# Patient Record
Sex: Male | Born: 1955
Health system: Southern US, Community
[De-identification: ages and names within clinical notes are randomized; demographics above are authoritative.]

## PROBLEM LIST (undated history)

## (undated) DIAGNOSIS — B20 Human immunodeficiency virus [HIV] disease: Secondary | ICD-10-CM

## (undated) DIAGNOSIS — C801 Malignant (primary) neoplasm, unspecified: Secondary | ICD-10-CM

## (undated) DIAGNOSIS — Z21 Asymptomatic human immunodeficiency virus [HIV] infection status: Secondary | ICD-10-CM

## (undated) DIAGNOSIS — E785 Hyperlipidemia, unspecified: Secondary | ICD-10-CM

## (undated) DIAGNOSIS — I219 Acute myocardial infarction, unspecified: Secondary | ICD-10-CM

## (undated) HISTORY — PX: OTHER SURGICAL HISTORY: SHX169

## (undated) HISTORY — DX: Asymptomatic human immunodeficiency virus (hiv) infection status: Z21

## (undated) HISTORY — DX: Acute myocardial infarction, unspecified: I21.9

## (undated) HISTORY — DX: Human immunodeficiency virus (HIV) disease: B20

## (undated) HISTORY — DX: Malignant (primary) neoplasm, unspecified: C80.1

## (undated) HISTORY — PX: NASAL SEPTUM SURGERY: SHX37

## (undated) HISTORY — PX: PTCA: SHX146

## (undated) HISTORY — DX: Hyperlipidemia, unspecified: E78.5

---

## 1997-11-10 ENCOUNTER — Encounter (INDEPENDENT_AMBULATORY_CARE_PROVIDER_SITE_OTHER): Payer: Self-pay | Admitting: *Deleted

## 1997-11-10 LAB — CONVERTED CEMR LAB: CD4 Count: 156 microliters

## 2000-09-10 ENCOUNTER — Ambulatory Visit (HOSPITAL_COMMUNITY): Admission: RE | Admit: 2000-09-10 | Discharge: 2000-09-10 | Payer: Self-pay | Admitting: Infectious Diseases

## 2000-09-10 ENCOUNTER — Encounter: Admission: RE | Admit: 2000-09-10 | Discharge: 2000-09-10 | Payer: Self-pay | Admitting: Infectious Diseases

## 2000-09-10 ENCOUNTER — Encounter: Payer: Self-pay | Admitting: Infectious Diseases

## 2000-11-06 ENCOUNTER — Encounter: Admission: RE | Admit: 2000-11-06 | Discharge: 2000-11-06 | Payer: Self-pay | Admitting: Infectious Diseases

## 2001-03-24 ENCOUNTER — Encounter: Admission: RE | Admit: 2001-03-24 | Discharge: 2001-03-24 | Payer: Self-pay | Admitting: Infectious Diseases

## 2001-03-24 ENCOUNTER — Ambulatory Visit (HOSPITAL_COMMUNITY): Admission: RE | Admit: 2001-03-24 | Discharge: 2001-03-24 | Payer: Self-pay | Admitting: Infectious Diseases

## 2001-04-09 ENCOUNTER — Encounter: Admission: RE | Admit: 2001-04-09 | Discharge: 2001-04-09 | Payer: Self-pay | Admitting: Infectious Diseases

## 2001-05-10 ENCOUNTER — Inpatient Hospital Stay (HOSPITAL_COMMUNITY): Admission: EM | Admit: 2001-05-10 | Discharge: 2001-05-14 | Payer: Self-pay | Admitting: *Deleted

## 2001-05-10 ENCOUNTER — Encounter: Payer: Self-pay | Admitting: *Deleted

## 2001-05-10 DIAGNOSIS — I259 Chronic ischemic heart disease, unspecified: Secondary | ICD-10-CM | POA: Insufficient documentation

## 2001-05-10 DIAGNOSIS — I252 Old myocardial infarction: Secondary | ICD-10-CM

## 2001-05-11 ENCOUNTER — Encounter: Payer: Self-pay | Admitting: Cardiology

## 2001-06-03 ENCOUNTER — Encounter (HOSPITAL_COMMUNITY): Admission: RE | Admit: 2001-06-03 | Discharge: 2001-09-01 | Payer: Self-pay | Admitting: Cardiology

## 2001-09-02 ENCOUNTER — Encounter: Admission: RE | Admit: 2001-09-02 | Discharge: 2001-09-02 | Payer: Self-pay | Admitting: Infectious Diseases

## 2001-09-02 ENCOUNTER — Encounter (HOSPITAL_COMMUNITY): Admission: RE | Admit: 2001-09-02 | Discharge: 2001-10-17 | Payer: Self-pay | Admitting: Cardiology

## 2001-09-02 ENCOUNTER — Ambulatory Visit (HOSPITAL_COMMUNITY): Admission: RE | Admit: 2001-09-02 | Discharge: 2001-09-02 | Payer: Self-pay | Admitting: Infectious Diseases

## 2001-09-16 ENCOUNTER — Encounter: Admission: RE | Admit: 2001-09-16 | Discharge: 2001-09-16 | Payer: Self-pay | Admitting: Infectious Diseases

## 2001-10-22 ENCOUNTER — Ambulatory Visit (HOSPITAL_COMMUNITY): Admission: RE | Admit: 2001-10-22 | Discharge: 2001-10-22 | Payer: Self-pay | Admitting: Cardiology

## 2001-10-22 ENCOUNTER — Encounter: Payer: Self-pay | Admitting: Cardiology

## 2002-02-23 ENCOUNTER — Ambulatory Visit (HOSPITAL_COMMUNITY): Admission: RE | Admit: 2002-02-23 | Discharge: 2002-02-23 | Payer: Self-pay | Admitting: Infectious Diseases

## 2002-02-23 ENCOUNTER — Encounter: Admission: RE | Admit: 2002-02-23 | Discharge: 2002-02-23 | Payer: Self-pay | Admitting: Infectious Diseases

## 2002-03-09 ENCOUNTER — Encounter: Admission: RE | Admit: 2002-03-09 | Discharge: 2002-03-09 | Payer: Self-pay | Admitting: Infectious Diseases

## 2002-09-16 ENCOUNTER — Encounter: Admission: RE | Admit: 2002-09-16 | Discharge: 2002-09-16 | Payer: Self-pay | Admitting: Infectious Diseases

## 2002-09-30 ENCOUNTER — Ambulatory Visit (HOSPITAL_COMMUNITY): Admission: RE | Admit: 2002-09-30 | Discharge: 2002-09-30 | Payer: Self-pay | Admitting: Cardiology

## 2002-09-30 ENCOUNTER — Encounter: Payer: Self-pay | Admitting: Cardiology

## 2003-03-16 ENCOUNTER — Encounter: Payer: Self-pay | Admitting: Infectious Diseases

## 2003-03-16 ENCOUNTER — Ambulatory Visit (HOSPITAL_COMMUNITY): Admission: RE | Admit: 2003-03-16 | Discharge: 2003-03-16 | Payer: Self-pay | Admitting: Infectious Diseases

## 2003-03-16 ENCOUNTER — Encounter: Admission: RE | Admit: 2003-03-16 | Discharge: 2003-03-16 | Payer: Self-pay | Admitting: Infectious Diseases

## 2003-04-01 ENCOUNTER — Encounter: Admission: RE | Admit: 2003-04-01 | Discharge: 2003-04-01 | Payer: Self-pay | Admitting: Infectious Diseases

## 2003-10-13 ENCOUNTER — Ambulatory Visit (HOSPITAL_COMMUNITY): Admission: RE | Admit: 2003-10-13 | Discharge: 2003-10-13 | Payer: Self-pay | Admitting: Infectious Diseases

## 2003-10-13 ENCOUNTER — Encounter: Admission: RE | Admit: 2003-10-13 | Discharge: 2003-10-13 | Payer: Self-pay | Admitting: Infectious Diseases

## 2003-11-03 ENCOUNTER — Encounter: Admission: RE | Admit: 2003-11-03 | Discharge: 2003-11-03 | Payer: Self-pay | Admitting: Infectious Diseases

## 2004-03-20 ENCOUNTER — Ambulatory Visit: Payer: Self-pay | Admitting: Infectious Diseases

## 2004-03-20 ENCOUNTER — Ambulatory Visit (HOSPITAL_COMMUNITY): Admission: RE | Admit: 2004-03-20 | Discharge: 2004-03-20 | Payer: Self-pay | Admitting: Infectious Diseases

## 2004-04-03 ENCOUNTER — Ambulatory Visit: Payer: Self-pay | Admitting: Infectious Diseases

## 2004-10-18 ENCOUNTER — Ambulatory Visit (HOSPITAL_COMMUNITY): Admission: RE | Admit: 2004-10-18 | Discharge: 2004-10-18 | Payer: Self-pay | Admitting: Infectious Diseases

## 2004-10-18 ENCOUNTER — Ambulatory Visit: Payer: Self-pay | Admitting: Infectious Diseases

## 2004-11-08 ENCOUNTER — Ambulatory Visit: Payer: Self-pay | Admitting: Infectious Diseases

## 2004-12-11 ENCOUNTER — Ambulatory Visit: Payer: Self-pay | Admitting: Gastroenterology

## 2005-02-16 ENCOUNTER — Ambulatory Visit: Payer: Self-pay | Admitting: Gastroenterology

## 2005-03-21 ENCOUNTER — Ambulatory Visit (HOSPITAL_COMMUNITY): Admission: RE | Admit: 2005-03-21 | Discharge: 2005-03-21 | Payer: Self-pay | Admitting: Infectious Diseases

## 2005-03-21 ENCOUNTER — Encounter (INDEPENDENT_AMBULATORY_CARE_PROVIDER_SITE_OTHER): Payer: Self-pay | Admitting: *Deleted

## 2005-03-21 ENCOUNTER — Ambulatory Visit: Payer: Self-pay | Admitting: Infectious Diseases

## 2005-03-21 LAB — CONVERTED CEMR LAB
CD4 Count: 810 microliters
HIV 1 RNA Quant: 399 copies/mL

## 2005-04-04 ENCOUNTER — Ambulatory Visit: Payer: Self-pay | Admitting: Infectious Diseases

## 2005-10-15 ENCOUNTER — Encounter: Admission: RE | Admit: 2005-10-15 | Discharge: 2005-10-15 | Payer: Self-pay | Admitting: Infectious Diseases

## 2005-10-15 ENCOUNTER — Encounter (INDEPENDENT_AMBULATORY_CARE_PROVIDER_SITE_OTHER): Payer: Self-pay | Admitting: *Deleted

## 2005-10-15 ENCOUNTER — Ambulatory Visit: Payer: Self-pay | Admitting: Infectious Diseases

## 2005-10-15 LAB — CONVERTED CEMR LAB: CD4 Count: 610 microliters

## 2005-11-07 ENCOUNTER — Ambulatory Visit: Payer: Self-pay | Admitting: Infectious Diseases

## 2006-01-31 DIAGNOSIS — B20 Human immunodeficiency virus [HIV] disease: Secondary | ICD-10-CM

## 2006-01-31 DIAGNOSIS — H3090 Unspecified chorioretinal inflammation, unspecified eye: Secondary | ICD-10-CM | POA: Insufficient documentation

## 2006-01-31 DIAGNOSIS — E785 Hyperlipidemia, unspecified: Secondary | ICD-10-CM

## 2006-01-31 DIAGNOSIS — I2510 Atherosclerotic heart disease of native coronary artery without angina pectoris: Secondary | ICD-10-CM | POA: Insufficient documentation

## 2006-01-31 DIAGNOSIS — Z85828 Personal history of other malignant neoplasm of skin: Secondary | ICD-10-CM

## 2006-03-04 ENCOUNTER — Encounter (HOSPITAL_COMMUNITY): Admission: RE | Admit: 2006-03-04 | Discharge: 2006-03-04 | Payer: Self-pay | Admitting: Cardiology

## 2006-03-25 ENCOUNTER — Ambulatory Visit: Payer: Self-pay | Admitting: Infectious Diseases

## 2006-03-25 ENCOUNTER — Encounter (INDEPENDENT_AMBULATORY_CARE_PROVIDER_SITE_OTHER): Payer: Self-pay | Admitting: *Deleted

## 2006-03-25 ENCOUNTER — Encounter: Admission: RE | Admit: 2006-03-25 | Discharge: 2006-03-25 | Payer: Self-pay | Admitting: Infectious Diseases

## 2006-04-10 ENCOUNTER — Ambulatory Visit: Payer: Self-pay | Admitting: Infectious Diseases

## 2006-06-14 DIAGNOSIS — B009 Herpesviral infection, unspecified: Secondary | ICD-10-CM | POA: Insufficient documentation

## 2006-06-14 DIAGNOSIS — A318 Other mycobacterial infections: Secondary | ICD-10-CM

## 2006-06-17 ENCOUNTER — Encounter (INDEPENDENT_AMBULATORY_CARE_PROVIDER_SITE_OTHER): Payer: Self-pay | Admitting: *Deleted

## 2006-06-30 ENCOUNTER — Encounter (INDEPENDENT_AMBULATORY_CARE_PROVIDER_SITE_OTHER): Payer: Self-pay | Admitting: *Deleted

## 2006-10-14 ENCOUNTER — Telehealth: Payer: Self-pay | Admitting: Infectious Diseases

## 2006-10-30 ENCOUNTER — Ambulatory Visit: Payer: Self-pay | Admitting: Infectious Diseases

## 2006-10-30 ENCOUNTER — Encounter: Admission: RE | Admit: 2006-10-30 | Discharge: 2006-10-30 | Payer: Self-pay | Admitting: Infectious Diseases

## 2006-10-30 LAB — CONVERTED CEMR LAB
ALT: 18 units/L (ref 0–53)
AST: 18 units/L (ref 0–37)
Albumin: 3.9 g/dL (ref 3.5–5.2)
Alkaline Phosphatase: 37 units/L — ABNORMAL LOW (ref 39–117)
CO2: 26 meq/L (ref 19–32)
Calcium: 9.2 mg/dL (ref 8.4–10.5)
HDL: 55 mg/dL (ref >39)
HIV 1 RNA Quant: <50 copies/mL (ref <50)
HIV-1 RNA Quant, Log: <1.7 (ref <1.70)
Hemoglobin, Urine: NEGATIVE
Hemoglobin: 15.2 g/dL (ref 13.0–17.0)
Ketones, ur: NEGATIVE mg/dL
Lymphocytes Relative: 35 % (ref 12–46)
Lymphs Abs: 1.6 10*3/uL (ref 0.7–3.3)
MCHC: 34.9 g/dL (ref 30.0–36.0)
Monocytes Absolute: 0.6 10*3/uL (ref 0.2–0.7)
Monocytes Relative: 12 % — ABNORMAL HIGH (ref 3–11)
Neutro Abs: 2.2 10*3/uL (ref 1.7–7.7)
Neutrophils Relative %: 50 % (ref 43–77)
Nitrite: NEGATIVE
Protein, ur: NEGATIVE mg/dL
RDW: 12.8 % (ref 11.5–14.0)
Specific Gravity, Urine: 1.02 (ref 1.005–1.03)
Total Protein: 6.1 g/dL (ref 6.0–8.3)
Urobilinogen, UA: 0.2 (ref 0.0–1.0)
VLDL: 37 mg/dL (ref 0–40)

## 2006-10-31 ENCOUNTER — Encounter: Payer: Self-pay | Admitting: Infectious Diseases

## 2006-10-31 LAB — CONVERTED CEMR LAB: CD4 Count: 630 microliters

## 2006-11-20 ENCOUNTER — Telehealth: Payer: Self-pay | Admitting: Infectious Diseases

## 2006-11-20 ENCOUNTER — Ambulatory Visit: Payer: Self-pay | Admitting: Infectious Diseases

## 2006-11-22 ENCOUNTER — Encounter: Payer: Self-pay | Admitting: Infectious Diseases

## 2007-02-13 ENCOUNTER — Encounter: Admission: RE | Admit: 2007-02-13 | Discharge: 2007-02-13 | Payer: Self-pay | Admitting: Infectious Diseases

## 2007-02-13 ENCOUNTER — Ambulatory Visit: Payer: Self-pay | Admitting: *Deleted

## 2007-02-13 ENCOUNTER — Encounter: Payer: Self-pay | Admitting: Infectious Diseases

## 2007-02-13 LAB — CONVERTED CEMR LAB
Basophils Absolute: 0 10*3/uL (ref 0.0–0.1)
Basophils Relative: 0 % (ref 0–1)
Calcium: 8.7 mg/dL (ref 8.4–10.5)
Chloride: 107 meq/L (ref 96–112)
Creatinine, Ser: 0.95 mg/dL (ref 0.40–1.50)
Eosinophils Relative: 2 % (ref 0–5)
HIV-1 RNA Quant, Log: <1.7 (ref <1.70)
Lymphocytes Relative: 38 % (ref 12–46)
Lymphs Abs: 2 10*3/uL (ref 0.7–3.3)
Monocytes Relative: 11 % (ref 3–11)
Potassium: 4.3 meq/L (ref 3.5–5.3)
RDW: 12.8 % (ref 11.5–14.0)
Sodium: 141 meq/L (ref 135–145)
Total Protein: 5.6 g/dL — ABNORMAL LOW (ref 6.0–8.3)
WBC: 5.4 10*3/uL (ref 4.0–10.5)

## 2007-02-24 ENCOUNTER — Telehealth: Payer: Self-pay | Admitting: Infectious Diseases

## 2007-04-14 ENCOUNTER — Ambulatory Visit: Payer: Self-pay | Admitting: Infectious Diseases

## 2007-05-16 ENCOUNTER — Telehealth (INDEPENDENT_AMBULATORY_CARE_PROVIDER_SITE_OTHER): Payer: Self-pay | Admitting: *Deleted

## 2007-09-03 ENCOUNTER — Encounter: Payer: Self-pay | Admitting: Infectious Diseases

## 2007-10-15 ENCOUNTER — Encounter: Admission: RE | Admit: 2007-10-15 | Discharge: 2007-10-15 | Payer: Self-pay | Admitting: Infectious Diseases

## 2007-10-15 ENCOUNTER — Ambulatory Visit: Payer: Self-pay | Admitting: Infectious Diseases

## 2007-10-15 LAB — CONVERTED CEMR LAB
ALT: 36 units/L (ref 0–53)
AST: 29 units/L (ref 0–37)
Albumin: 4 g/dL (ref 3.5–5.2)
Alkaline Phosphatase: 46 units/L (ref 39–117)
BUN: 18 mg/dL (ref 6–23)
CO2: 25 meq/L (ref 19–32)
Chloride: 107 meq/L (ref 96–112)
Glucose, Bld: 103 mg/dL — ABNORMAL HIGH (ref 70–99)
HCT: 42.7 % (ref 39.0–52.0)
HIV-1 RNA Quant, Log: <1.7 (ref <1.70)
Lymphs Abs: 1.8 10*3/uL (ref 0.7–4.0)
MCV: 101.2 fL — ABNORMAL HIGH (ref 78.0–100.0)
Neutro Abs: 2.8 10*3/uL (ref 1.7–7.7)
Neutrophils Relative %: 52 % (ref 43–77)
RDW: 13.4 % (ref 11.5–15.5)
Sodium: 141 meq/L (ref 135–145)
WBC: 5.5 10*3/uL (ref 4.0–10.5)

## 2007-10-29 ENCOUNTER — Ambulatory Visit: Payer: Self-pay | Admitting: Infectious Diseases

## 2008-02-01 ENCOUNTER — Encounter: Payer: Self-pay | Admitting: Infectious Diseases

## 2008-02-24 ENCOUNTER — Ambulatory Visit: Payer: Self-pay | Admitting: Infectious Diseases

## 2008-02-24 LAB — CONVERTED CEMR LAB
CO2: 23 meq/L (ref 19–32)
Calcium: 9 mg/dL (ref 8.4–10.5)
Creatinine, Ser: 1.05 mg/dL (ref 0.40–1.50)
Eosinophils Absolute: 0.2 10*3/uL (ref 0.0–0.7)
Glucose, Bld: 112 mg/dL — ABNORMAL HIGH (ref 70–99)
HCT: 41.9 % (ref 39.0–52.0)
HIV 1 RNA Quant: <48 copies/mL (ref <48)
MCHC: 34.1 g/dL (ref 30.0–36.0)
MCV: 102.7 fL — ABNORMAL HIGH (ref 78.0–100.0)
Monocytes Absolute: 0.7 10*3/uL (ref 0.1–1.0)
Platelets: 277 10*3/uL (ref 150–400)
Potassium: 4 meq/L (ref 3.5–5.3)
RDW: 13.3 % (ref 11.5–15.5)
Total Bilirubin: 0.4 mg/dL (ref 0.3–1.2)
Total Protein: 6.1 g/dL (ref 6.0–8.3)
WBC: 5.2 10*3/uL (ref 4.0–10.5)

## 2008-03-09 ENCOUNTER — Ambulatory Visit: Payer: Self-pay | Admitting: Infectious Diseases

## 2008-10-20 ENCOUNTER — Ambulatory Visit: Payer: Self-pay | Admitting: Infectious Diseases

## 2008-10-20 LAB — CONVERTED CEMR LAB
ALT: 35 units/L (ref 0–53)
Albumin: 4 g/dL (ref 3.5–5.2)
BUN: 16 mg/dL (ref 6–23)
Basophils Absolute: 0 10*3/uL (ref 0.0–0.1)
Basophils Relative: 0 % (ref 0–1)
CO2: 22 meq/L (ref 19–32)
Eosinophils Relative: 1 % (ref 0–5)
Glucose, Bld: 102 mg/dL — ABNORMAL HIGH (ref 70–99)
HCT: 43.3 % (ref 39.0–52.0)
HDL: 40 mg/dL (ref >39)
HIV 1 RNA Quant: <48 copies/mL (ref <48)
HIV-1 RNA Quant, Log: <1.68 (ref <1.68)
MCV: 102.4 fL — ABNORMAL HIGH (ref 78.0–100.0)
Monocytes Relative: 12 % (ref 3–12)
Neutro Abs: 2.5 10*3/uL (ref 1.7–7.7)
Neutrophils Relative %: 53 % (ref 43–77)
RBC: 4.23 M/uL (ref 4.22–5.81)
Sodium: 143 meq/L (ref 135–145)
Total CHOL/HDL Ratio: 3.7
Total Protein: 6.2 g/dL (ref 6.0–8.3)
VLDL: 19 mg/dL (ref 0–40)

## 2008-11-24 ENCOUNTER — Ambulatory Visit: Payer: Self-pay | Admitting: Infectious Diseases

## 2009-05-17 ENCOUNTER — Ambulatory Visit: Payer: Self-pay | Admitting: Infectious Diseases

## 2009-05-17 LAB — CONVERTED CEMR LAB
BUN: 18 mg/dL (ref 6–23)
CO2: 25 meq/L (ref 19–32)
Chloride: 105 meq/L (ref 96–112)
Cholesterol: 179 mg/dL (ref 0–200)
Creatinine, Ser: 1.02 mg/dL (ref 0.40–1.50)
Eosinophils Relative: 1 % (ref 0–5)
Glucose, Bld: 88 mg/dL (ref 70–99)
Hemoglobin: 15.6 g/dL (ref 13.0–17.0)
LDL Cholesterol: 103 mg/dL — ABNORMAL HIGH (ref 0–99)
Lymphocytes Relative: 28 % (ref 12–46)
Lymphs Abs: 1.6 10*3/uL (ref 0.7–4.0)
MCHC: 34.5 g/dL (ref 30.0–36.0)
MCV: 102.5 fL — ABNORMAL HIGH (ref >=78.0)
Monocytes Absolute: 0.6 10*3/uL (ref 0.1–1.0)
Monocytes Relative: 11 % (ref 3–12)
Platelets: 287 10*3/uL (ref 150–400)
RDW: 13.2 % (ref 11.5–15.5)
Sodium: 140 meq/L (ref 135–145)
Total CHOL/HDL Ratio: 3.3
Triglycerides: 109 mg/dL (ref <150)
VLDL: 22 mg/dL (ref 0–40)
WBC: 5.6 10*3/uL (ref 4.0–10.5)

## 2009-05-31 ENCOUNTER — Ambulatory Visit: Payer: Self-pay | Admitting: Infectious Diseases

## 2009-12-19 ENCOUNTER — Ambulatory Visit: Payer: Self-pay | Admitting: Infectious Diseases

## 2009-12-19 LAB — CONVERTED CEMR LAB
ALT: 23 units/L (ref 0–53)
AST: 24 units/L (ref 0–37)
BUN: 20 mg/dL (ref 6–23)
Basophils Absolute: 0 10*3/uL (ref 0.0–0.1)
CO2: 23 meq/L (ref 19–32)
Creatinine, Ser: 1 mg/dL (ref 0.40–1.50)
Eosinophils Relative: 1 % (ref 0–5)
HCT: 42.8 % (ref 39.0–52.0)
Lymphs Abs: 1.9 10*3/uL (ref 0.7–4.0)
Monocytes Absolute: 0.7 10*3/uL (ref 0.1–1.0)
Monocytes Relative: 12 % (ref 3–12)
Neutro Abs: 3.3 10*3/uL (ref 1.7–7.7)
Neutrophils Relative %: 55 % (ref 43–77)
Platelets: 255 10*3/uL (ref 150–400)
RDW: 13.2 % (ref 11.5–15.5)
Total Bilirubin: 0.4 mg/dL (ref 0.3–1.2)
Total Protein: 5.8 g/dL — ABNORMAL LOW (ref 6.0–8.3)
WBC: 6 10*3/uL (ref 4.0–10.5)

## 2010-01-09 ENCOUNTER — Ambulatory Visit: Payer: Self-pay | Admitting: Infectious Diseases

## 2010-03-14 ENCOUNTER — Encounter (INDEPENDENT_AMBULATORY_CARE_PROVIDER_SITE_OTHER): Payer: Self-pay | Admitting: *Deleted

## 2010-05-21 LAB — CONVERTED CEMR LAB
ALT: 28 units/L (ref 0–53)
Albumin: 3.5 g/dL (ref 3.5–5.2)
BUN: 14 mg/dL (ref 6–23)
Basophils Relative: 0 % (ref 0–1)
Chloride: 110 meq/L (ref 96–112)
HCT: 43.5 % (ref 41.0–49.0)
HIV 1 RNA Quant: <50 copies/mL (ref <50)
HIV-1 RNA Quant, Log: <1.7 (ref <1.70)
Hemoglobin: 15 g/dL (ref 13.9–16.8)
Lymphs Abs: 1.8 10*3/uL (ref 0.8–3.1)
MCV: 102.8 fL — ABNORMAL HIGH (ref 78.8–100.0)
Monocytes Absolute: 0.6 10*3/uL (ref 0.2–0.7)
Platelets: 275 10*3/uL (ref 152–374)
Potassium: 4.3 meq/L (ref 3.5–5.3)
RBC: 4.23 M/uL (ref 4.20–5.50)
Total Bilirubin: 0.4 mg/dL (ref 0.3–1.2)

## 2010-05-23 NOTE — Assessment & Plan Note (Signed)
Summary: F/U /OV/VS   CC:  follow-up visit.  History of Present Illness: 1 yoM with HIV+. He is previously heavily experienced (FTV, DDI, D4T, IDV, AZT).  Has been on EPZ/EFV, currently on Atripla.   His course has been further complicated by MI and stenting.   CD4 880, VL <48 (12-19-09) and cholesterol nl (05-17-09). has been exercising more, has lost inches but not losing weight. still working with trainer.   Preventive Screening-Counseling & Management  Alcohol-Tobacco     Alcohol drinks/day: 1     Alcohol type: cocktail     Smoking Status: never  Caffeine-Diet-Exercise     Caffeine use/day: sodas     Does Patient Exercise: yes     Type of exercise: walking     Exercise (avg: min/session): <30     Times/week: 5  Safety-Violence-Falls     Seat Belt Use: yes  Current Medications (verified): 1)  Lipitor 80 Mg Tabs (Atorvastatin Calcium) .... Take 1 Tablet By Mouth At Bedtime 2)  Altace  Caps (Ramipril Caps) .... 5mg  Once Daily 3)  Toprol Xl  Tb24 (Metoprolol Succinate Tb24) .... 25ng Once Daily 4)  Aspirin  Tabs (Aspirin Tabs) .... 81mg  Once Daily 5)  B Complex  Caps (B Complex Vitamins) 6)  Tricor 145 Mg  Tabs (Fenofibrate) .... Take 1 Tablet By Mouth Once A Day 7)  Atripla 600-200-300 Mg  Tabs (Efavirenz-Emtricitab-Tenofovir) .... Take 1 Tablet By Mouth At Bedtime  Allergies (verified): No Known Drug Allergies    Updated Prior Medication List: LIPITOR 80 MG TABS (ATORVASTATIN CALCIUM) Take 1 tablet by mouth at bedtime ALTACE  CAPS (RAMIPRIL CAPS) 5mg  once daily TOPROL XL  TB24 (METOPROLOL SUCCINATE TB24) 25ng once daily ASPIRIN  TABS (ASPIRIN TABS) 81mg  once daily B COMPLEX  CAPS (B COMPLEX VITAMINS)  TRICOR 145 MG  TABS (FENOFIBRATE) Take 1 tablet by mouth once a day ATRIPLA 600-200-300 MG  TABS (EFAVIRENZ-EMTRICITAB-TENOFOVIR) Take 1 tablet by mouth at bedtime  Current Allergies (reviewed today): No known allergies  Review of Systems  The patient denies  chest pain.    Vital Signs:  Patient profile:   55 year old male Height:      68 inches (172.72 cm) Weight:      209.4 pounds (95.18 kg) BMI:     31.95 Temp:     98.5 degrees F (36.94 degrees C) oral Pulse rate:   63 / minute BP sitting:   114 / 72  (left arm)  Vitals Entered By: Baxter Hire) (January 09, 2010 9:16 AM) CC: follow-up visit Pain Assessment Patient in pain? no      Nutritional Status BMI of > 30 = obese Nutritional Status Detail appetite is good per patient  Have you ever been in a relationship where you felt threatened, hurt or afraid?No   Does patient need assistance? Functional Status Self care Ambulation Normal   Physical Exam  General:  well-developed, well-nourished, well-hydrated, and overweight-appearing.   Eyes:  pupils equal, pupils round, and pupils reactive to light.   Mouth:  pharynx pink and moist, no erythema, and no exudates.   Neck:  no masses.   Lungs:  normal respiratory effort and normal breath sounds.   Heart:  normal rate, regular rhythm, and no murmur.   Abdomen:  soft, non-tender, and normal bowel sounds.     Impression & Recommendations:  Problem # 1:  HIV DISEASE (ICD-042)  he is doing very well. counsled about safe sex practices. flu shot today. return  to clinic 6 months.   His updated medication list for this problem includes:    Valacyclovir Hcl 1 Gm Tabs (Valacyclovir hcl) .Marland Kitchen... 1 tab two times a day for 5 days at onset of fever blisters.  Problem # 2:  MYOCARDIAL INFARCTION, HX OF (ICD-412) he is doing well, BP well controlled. cont to f/u with his cardiologist.  His updated medication list for this problem includes:    Altace Caps (Ramipril caps) ..... 5mg  once daily    Toprol Xl Tb24 (Metoprolol succinate tb24) .Marland Kitchen... 25ng once daily    Aspirin Tabs (Aspirin tabs) ..... 81mg  once daily  Problem # 3:  HERPES SIMPLEX, UNCOMPLICATED (ICD-054.9) will refil lhis VTX  Medications Added to Medication List This  Visit: 1)  Valacyclovir Hcl 1 Gm Tabs (Valacyclovir hcl) .Marland Kitchen.. 1 tab two times a day for 5 days at onset of fever blisters. Prescriptions: VALACYCLOVIR HCL 1 GM TABS (VALACYCLOVIR HCL) 1 tab two times a day for 5 days at onset of fever blisters.  #10 x 5   Entered and Authorized by:   Johny Sax MD   Signed by:   Johny Sax MD on 01/09/2010   Method used:   Electronically to        [facility]  [phone]* (retail)       [address] [place], [place]  [address]       Ph: [phone] or [phone]       Fax: [phone]   RxID:   [id number]   Appended Document: Orders Update    Clinical Lists Changes  Orders: Added new Service order of Influenza Vaccine NON MCR (88416) - Signed Observations: Added new observation of FLU VAX#1VIS: 11/14/06 version given January 09, 2010. (01/09/2010 11:28) Added new observation of FLU VAXLOT: 11033P (01/09/2010 11:28) Added new observation of FLU VAX EXP: 07/23/2010 (01/09/2010 11:28) Added new observation of FLU VAXBY: Kathi Simpers [facility]) (01/09/2010 11:28) Added new observation of FLU VAXRTE: IM (01/09/2010 11:28) Added new observation of FLU VAX DSE: 0.5 ml (01/09/2010 11:28) Added new observation of FLU VAXMFR: Novartis (01/09/2010 11:28) Added new observation of FLU VAX SITE: right deltoid (01/09/2010 11:28) Added new observation of FLU VAX: Fluvax Non-MCR (01/09/2010 11:28)       Influenza Vaccine    Vaccine Type: Fluvax Non-MCR    Site: right deltoid    Mfr: Novartis    Dose: 0.5 ml    Route: IM    Given by: Kathi Simpers CMA(AAMA)    Exp. Date: 07/23/2010    Lot #: [id number]    VIS given: 11/14/06 version given January 09, 2010.  Flu Vaccine Consent Questions    Do you have a history of severe allergic reactions to this vaccine? no    Any prior history of allergic reactions to egg and/or gelatin? no    Do you have a sensitivity to the preservative Thimersol? no    Do you have a past history of  Guillan-Barre Syndrome? no    Do you currently have an acute febrile illness? no    Have you ever had a severe reaction to latex? no    Vaccine information given and explained to patient? yes

## 2010-05-23 NOTE — Miscellaneous (Signed)
  Clinical Lists Changes  Observations: Added new observation of YEARAIDSPOS: 1999  (03/14/2010 15:50)

## 2010-05-23 NOTE — Assessment & Plan Note (Signed)
Summary: EST-CK/FU/MEDS/CFB   CC:  follow-up visit.  History of Present Illness: 55 yoM with HIV+. He is previously heavily experienced (FTV, DDI, D4T, IDV, AZT).  Has been on EPZ/EFV, currently on Atripla.   His course has been further complicated by MI and stenting.   CD4 720, VL <48 and cholesterol nl (05-17-09).  doing well, no c/o. eating well, wt steady. plans to exercise more, his work has a Psychologist, educational now and a gym.   Preventive Screening-Counseling & Management  Alcohol-Tobacco     Alcohol drinks/day: 1     Alcohol type: cocktail     Smoking Status: never  Caffeine-Diet-Exercise     Caffeine use/day: sodas     Does Patient Exercise: yes     Type of exercise: walking     Exercise (avg: min/session): <30     Times/week: 5  Safety-Violence-Falls     Seat Belt Use: yes   Updated Prior Medication List: LIPITOR 80 MG TABS (ATORVASTATIN CALCIUM) Take 1 tablet by mouth at bedtime ALTACE  CAPS (RAMIPRIL CAPS) 5mg  once daily TOPROL XL  TB24 (METOPROLOL SUCCINATE TB24) 25ng once daily ASPIRIN  TABS (ASPIRIN TABS) 81mg  once daily B COMPLEX  CAPS (B COMPLEX VITAMINS)  TRICOR 145 MG  TABS (FENOFIBRATE) Take 1 tablet by mouth once a day ATRIPLA 600-200-300 MG  TABS (EFAVIRENZ-EMTRICITAB-TENOFOVIR) Take 1 tablet by mouth at bedtime   Past History:  Past medical, surgical, family and social histories (including risk factors) reviewed, and no changes noted (except as noted below).  Past Medical History: Reviewed history from 01/31/2006 and no changes required. Coronary artery disease,  PTCA and stent LAD HIV disease Hyperlipidemia Myocardial infarction, hx of Ethambutol induced visual changes  Family History: Reviewed history from 01/31/2006 and no changes required. Family History of CAD Male 1st degree relative <60  Social History: Reviewed history from 01/31/2006 and no changes required. Single Never Smoked Alcohol use-yes, social Drug use-no  Review of  Systems       wt down 11# since last visit (intentional). no CP, no SOB  Vital Signs:  Patient profile:   55 year old male Height:      68 inches (172.72 cm) Weight:      207.3 pounds (94.23 kg) BMI:     31.63 Temp:     97.4 degrees F (36.33 degrees C) oral Pulse rate:   70 / minute BP sitting:   98 / 63  (left arm)  Vitals Entered By: Baxter Hire) (May 31, 2009 3:01 PM) CC: follow-up visit Is Patient Diabetic? No Pain Assessment Patient in pain? no      Nutritional Status BMI of > 30 = obese Nutritional Status Detail appetite is good per patient  Have you ever been in a relationship where you felt threatened, hurt or afraid?No   Does patient need assistance? Functional Status Self care Ambulation Normal   Physical Exam  General:  well-developed, well-nourished, and well-hydrated.   Eyes:  pupils equal, pupils round, and pupils reactive to light.   Mouth:  pharynx pink and moist and no exudates.   Neck:  no masses.   Lungs:  normal respiratory effort and normal breath sounds.   Heart:  normal rate, regular rhythm, and no murmur.   Abdomen:  soft, non-tender, and normal bowel sounds.      Impression & Recommendations:  Problem # 1:  HIV DISEASE (ICD-042) he is doing very well. will cont him on his current rx's. his vaccines are uptodate. he  is offered condoms but is not sexually active. return to clinic 5-6 months.  The following medications were removed from the medication list:    Valtrex 1 Gm Tabs (Valacyclovir hcl) .Marland Kitchen... Take 1 tablet by mouth two times a day for 5 days  Problem # 2:  CORONARY ARTERY DISEASE, S/P PTCA (ICD-414.9) will begin exercise program. total chol is good but needs lower LDL. cont to f/u with Dr Sharyn Lull.  His updated medication list for this problem includes:    Altace Caps (Ramipril caps) ..... 5mg  once daily    Toprol Xl Tb24 (Metoprolol succinate tb24) .Marland Kitchen... 25ng once daily    Aspirin Tabs (Aspirin tabs) ..... 81mg  once  daily  Other Orders: Est. Patient Level IV (04540) Future Orders: T-CD4SP ([facility]) (CD4SP) ... 08/29/2009 T-HIV Viral Load [phone]) ... 08/29/2009 T-Comprehensive Metabolic Panel [phone]) ... 08/29/2009 T-CBC w/Diff ([id number]) ... 08/29/2009    Influenza Immunization History:    Influenza # 1:  Historical (03/07/2009)

## 2010-07-09 LAB — T-HELPER CELL (CD4) - (RCID CLINIC ONLY)
CD4 % Helper T Cell: 47 % (ref 33–55)
CD4 T Cell Abs: 720 uL (ref 400–2700)

## 2010-07-20 ENCOUNTER — Other Ambulatory Visit: Payer: Self-pay | Admitting: *Deleted

## 2010-07-20 DIAGNOSIS — B20 Human immunodeficiency virus [HIV] disease: Secondary | ICD-10-CM

## 2010-07-20 MED ORDER — EFAVIRENZ-EMTRICITAB-TENOFOVIR 600-200-300 MG PO TABS: 1.0000 | ORAL_TABLET | Freq: Every day | ORAL | Status: DC

## 2010-07-20 NOTE — Telephone Encounter (Signed)
Pt. Using [facility] specialty pharmacy not listed in system.  Fax# [phone], tel# [phone].

## 2010-07-31 LAB — T-HELPER CELL (CD4) - (RCID CLINIC ONLY): CD4 % Helper T Cell: 46 % (ref 33–55)

## 2010-09-08 NOTE — Cardiovascular Report (Signed)
[place]  Patient:    Riley, ON Visit Number: [id number] MRN: [id number]          Service Type: MED Location: [facility] 2927 01 Attending Physician:  Robynn Pane Dictated by:   Eduardo Osier Sharyn Cortez, M.D. Proc. Date: 05/10/01 Admit Date:  05/10/2001 Discharge Date: 05/14/2001   CC:         Cardiac Catheterization Lab  Lacretia Leigh. Ninetta Lights, M.D.   Cardiac Catheterization  PROCEDURE: 1. Left cardiac catheterization with selective left and right coronary    angiography, left ventricular angiography via right groin using Judkins    technique. 2. Insertion of intra-aortic balloon pump via right groin. 3. Successful percutaneous transluminal coronary angioplasty to proximal left    anterior descending artery using 3.5- x 15-mm long CrossSail balloon. 4. Successful deployment of 3.5- x 18-mm long Zeta Multi-link stent in    proximal left anterior descending artery.  INDICATION FOR PROCEDURE:  Riley Cortez is a 55 year old white male with a past medical history significant for HIV, positive family history of coronary artery disease.  Came to ER complaining of retrosternal chest pressure, grade 7/10, radiating to both hands associated with diaphoresis, shortness of breath, and tingling in both arms.  The patient went to Urgent Care center and had EKG done which showed normal sinus rhythm with Q waves in V1 to V4 with ST elevation in V2 to V6 and small Q waves in inferior leads with ST elevation. The patient received aspirin and two sublingual nitroglycerin with reduction of chest pain to 2/10 and was transferred via EMS to ER.  The patient states this happened around 10:30 a.m. at barber shop and prior to this, he went to the gym and exercised for more than 30 minutes without any chest pain.  Denies prior such episodes of chest pain in the past.  Denies palpitations, lightheadedness, or syncope.  Discussed with patient regarding EKG finding  and various options of treatment, i.e., thrombolytic therapy versus emergency left catheterization, possible PTCA and stenting, its risks and benefits, i.e., death, MI, stroke, need for emergency CABG, risk of restenosis, local vascular complications, etc., and consented for PCI.  DESCRIPTION OF PROCEDURE:  After obtaining the informed consent, the patient was directly taken to the catheterization lab and was placed on the fluoroscopy table.  The right groin was prepped and draped in the usual fashion.  Xylocaine, 2%, was used for local anesthesia in the right groin. With the help of a thin-walled needle, a #6 French arterial sheath was placed. The sheath was aspirated and flushed.  Next, a #7 [place] arterial sheath was placed.  The sheath was aspirated and flushed.  Next, a #6 French left Judkins catheter was advanced over the wire under fluoroscopic guidance up to the ascending aorta.  The wire was pulled out.  The catheter was aspirated and connected to the manifold.  The catheter was further advanced and engaged into the left coronary ostium.  Multiple views of the left system were taken. Next, the catheter was disengaged and was pulled out over the wire and was replaced with a #6 [place] right Judkins catheter which was advanced over the wire under fluoroscopic guidance up to the ascending aorta.  The wire was pulled out.  The catheter was aspirated and connected to the manifold.  The catheter was further advanced and engaged into the right coronary ostium. Multiple views of the right system were taken.  Next, the catheter was disengaged and was pulled out  over the wire and was replaced with a #6 French pigtail catheter which was advanced over the wire under fluoroscopic guidance up to the ascending aorta.  The wire was pulled out.  The catheter was aspirated and connected to the manifold.  The catheter was further advanced across the aortic valve into the LV.  LV pressures were  recorded.  Next, left ventriculography was done in 30-degree RAO position.  Post angiographic pressures were recorded from LV and then pullback pressures were recorded from the aorta.  There was no gradient across the aortic valve.  Next, the pigtail catheter was pulled out over the wire.  The sheath was aspirated and flushed.  FINDINGS:  The LV showed anterolateral, apical, and inferoapical wall akinesia.  There was also mid anterolateral wall dyskinesia.  EF of 30-35%.  1. The left main was patent. 2. The LAD was 100% occluded proximally after giving off diagonal #1.    Diagonal #1 was moderate sized which was patent. 3. The left circumflex was patent and it tapers down in the AV groove after    giving off OM-2.  OM-1 was large which has minimal plaquing.  OM-2 was    small which was patent. 4. The RCA was patent.  INTERVENTIONAL PROCEDURE:  Successful PTCA to 100% occluded proximal LAD was done using 3.5- x 15-mm long CrossSail balloon for predilatation and then 3.5- x 18-mm long Zeta Multi-link stent was deployed at eight atmospheres of pressure which was fully expanded going up to 11 atmospheres of pressure.  The lesion was dilated from 100% to 0% residual with excellent TIMI grade 3 distal flow without evidence of dissection or distal embolization.  The patient received weight-based heparin, ReoPro, Plavix during the procedure.  The patient had one episode of slow ventricular tachycardia during the procedure. Received 75 mg of IV lidocaine with conversion to sinus rhythm.  Post procedure, intra-aortic balloon pump was inserted via right groin without any complications with good diastolic augmentation of blood pressure.  The patient tolerated the procedure well.  There were no complications.  PLAN:  Continue with heparin for 24 hours and balloon pump for 24 hours and start on ACE inhibitors, beta blockers.  Continue on aspirin and Plavix.  The patient was transferred to CCU in  stable condition.  Dictated by:   Eduardo Osier Sharyn Cortez, M.D. Attending Physician:  Robynn Pane DD:  05/14/01 TD:  05/14/01 Job: [id number]

## 2010-09-08 NOTE — Discharge Summary (Signed)
Riley Cortez. [facility]  Patient:    Riley Cortez, Riley Cortez Visit Number: [id number] MRN: [id number]          Service Type: MED Location: [facility] 2927 01 Attending Physician:  Riley Cortez Dictated by:   Riley Cortez, M.D. Admit Date:  05/10/2001 Discharge Date: 05/14/2001   CC:         Riley Cortez C. Riley Cortez, M.D.                           Discharge Summary  ADMISSION DIAGNOSES: 1. Acute anterolateral wall myocardial infarction. 2. Human immunodeficiency virus positive. 3. Positive family history of coronary artery disease.  FINAL DIAGNOSES: 1. Status post acute anteroseptal, lateral, and apical wall myocardial    infarction, status post nonsustained ventricular tachycardia. 2. Hypercholesterolemia. 3. Human immunodeficiency virus positive. 4. Positive family history of coronary artery disease.  DISCHARGE MEDICATIONS: 1. Toprol XL 25 mg 1 tablet daily. 2. Altace 2.5 mg 1 tablet daily. 3. Enteric-coated aspirin 325 mg 1 tablet daily. 4. Plavix 75 mg 1 tablet daily with food. 5. Nitrostat 0.4 mg sublingual use as directed. 6. The patient has been advised to continue Sustiva, Epivir, and Ziagen as    before.  ACTIVITY:  Avoid heavy lifting, pushing, or pulling.  DIET:  Low-salt/low-cholesterol.  DISCHARGE INSTRUCTIONS:  Post-angioplasty and stent instructions have been given.  The patient will be scheduled for Phase II cardiac rehab as an outpatient.  FOLLOWUP:  Follow up with me in one week and follow up with Dr. Ninetta Cortez as scheduled.  CONDITION AT DISCHARGE:  Stable.  HISTORY:  [name] is a 55 year old white male with past medical history significant for HIV positive, positive family history of coronary artery disease.  He came to the ER complaining of retrosternal chest pressure, grade 7/10, radiating to both hands; associated with diaphoresis, shortness of breath, and tingling in both arms.  The patient went to Urgent Care center  and had an EKG done, which showed normal sinus rhythm with Q waves in V1-V4 with ST elevation in V2-V6, and small Q waves in inferior leads with ST elevation. The patient received aspirin and took sublingual nitroglycerin with partial relief of chest pain and was transferred via EMS to the ER.  The patient states this happened around 10:30 a.m. at the barber shop and prior to this went to the gym and exercised for more than 30 minutes without chest pain. The patient denies any history of such episodes of chest pain in the past and denies palpitations, lightheadedness, or syncope.  PAST MEDICAL HISTORY:  As above.  PAST SURGICAL HISTORY:  He had nasal surgery many years ago.  SOCIAL HISTORY:  He is single and has no children.  No history of smoking.  He drinks socially.  No history of drug abuse.  He works as a Counsellor. Born in [place].  FAMILY HISTORY:  Father died of MI at the age of 67.  Mother died of Alzheimers disease at the age of 53.  He has two brothers and one sister. They are in good health.  ALLERGIES:  No known drug allergies.  MEDICATIONS:  He takes Epivir, Sustiva, and Ziagen for his HIV.  PHYSICAL EXAMINATION:  GENERAL:  He is alert, awake, oriented x3, in no acute distress.  VITAL SIGNS:  Blood pressure 110/68, pulse 62 and regular.  HEENT:  Conjunctiva was pink.  NECK:  Supple, no JVD, no bruits.  LUNGS:  Clear  to auscultation without rhonchi or rales.  CARDIOVASCULAR:  S1, S2 was normal.  There was a soft S4 gallop and soft systolic murmur at the apex.  ABDOMEN:  Soft.  Bowel sounds were present.  Nontender.  EXTREMITIES:  There was no clubbing, cyanosis, or edema.  LABORATORY DATA:  His cholesterol is 189, LDL 116, HDL 50, triglycerides 115. His first set of CPK was 107, MB of 3.7, relative index 3.5.  Second set:  CPK was 3640, MB of 335, relative index 9.2.  Third set:  CPK 879, MB of 23.7, relative index of 2.7.  His CPK today is 205, MB  of 3.3.  Troponin first set was 0.15, second set was 241, third set was 53.07, fourth set was 16.49.  His sodium was 140, potassium 4.3, chloride 117, bicarb 18.  His blood sugar was 155.  Repeat fasting sugar was 99.  BUN was 12, creatinine 0.8.  His AST was 446, ALT 112, ALP 75.  Hemoglobin was 14.4, hematocrit 41.4, white count 10.4. We will repeat her liver enzymes in a few weeks and then decide whether to continue with Lipitor or not.  At this time, we will discontinue the Lipitor.  HOSPITAL COURSE:  The patient underwent emergency left catheterization and PTC and stenting to proximal LAD, as per procedure report.  At the end of the procedure, intra-aortic balloon pump was inserted, as a large area of myocardium was involved, and the patient remained hypertensive.  The patient tolerated the procedure well.  There were no complications.  The patient was subsequently transferred to CCU.  The patient did not have any further episodes of chest pain during the hospital stay.  The patient had one episode of nonsustained ventricular tachycardia during the hospital stay.  The patient was started on IV amiodarone with control of ventricular tachycardia. Amiodarone was continued for 48 hours, then was discontinued.  The patient did not have any further episodes of ventricular tachycardia during the hospital stay.  His intra-aortic balloon pump was discontinued on January 19 without any complications.  His groin had been stable.  Phase I cardiac rehab was called.  The patient has been ambulating in the hallway without any problems. The patient will be discharged home on the above medications and will be followed up in my office in one week.  We will check his liver function as an outpatient in a few weeks and decide on medication for hyperlipidemia. Dictated by:   Riley Cortez, M.D. Attending Physician:  Riley Cortez DD:  05/14/00 TD:  05/15/01 Job: [id number]

## 2011-01-01 ENCOUNTER — Other Ambulatory Visit (INDEPENDENT_AMBULATORY_CARE_PROVIDER_SITE_OTHER): Payer: Managed Care, Other (non HMO)

## 2011-01-01 DIAGNOSIS — E785 Hyperlipidemia, unspecified: Secondary | ICD-10-CM

## 2011-01-01 DIAGNOSIS — B20 Human immunodeficiency virus [HIV] disease: Secondary | ICD-10-CM

## 2011-01-01 DIAGNOSIS — Z113 Encounter for screening for infections with a predominantly sexual mode of transmission: Secondary | ICD-10-CM

## 2011-01-02 LAB — COMPREHENSIVE METABOLIC PANEL
ALT: 30 U/L (ref 0–53)
Albumin: 4 g/dL (ref 3.5–5.2)
CO2: 26 mEq/L (ref 19–32)
Glucose, Bld: 94 mg/dL (ref 70–99)
Potassium: 4.6 mEq/L (ref 3.5–5.3)
Sodium: 140 mEq/L (ref 135–145)
Total Bilirubin: 0.5 mg/dL (ref 0.3–1.2)
Total Protein: 6.1 g/dL (ref 6.0–8.3)

## 2011-01-02 LAB — CBC WITH DIFFERENTIAL/PLATELET
Basophils Absolute: 0 10*3/uL (ref 0.0–0.1)
Eosinophils Absolute: 0.1 10*3/uL (ref 0.0–0.7)
Eosinophils Relative: 1 % (ref 0–5)
Lymphocytes Relative: 36 % (ref 12–46)
MCH: 34.8 pg — ABNORMAL HIGH (ref 26.0–34.0)
MCV: 103.1 fL — ABNORMAL HIGH (ref 78.0–100.0)
Neutrophils Relative %: 51 % (ref 43–77)
Platelets: 280 10*3/uL (ref 150–400)
RBC: 4.25 MIL/uL (ref 4.22–5.81)
RDW: 13.6 % (ref 11.5–15.5)
WBC: 5.2 10*3/uL (ref 4.0–10.5)

## 2011-01-02 LAB — LIPID PANEL: Cholesterol: 180 mg/dL (ref 0–200)

## 2011-01-02 LAB — RPR

## 2011-01-02 LAB — HIV-1 RNA QUANT-NO REFLEX-BLD: HIV 1 RNA Quant: <20 copies/mL (ref <20)

## 2011-01-02 LAB — T-HELPER CELL (CD4) - (RCID CLINIC ONLY)
CD4 % Helper T Cell: 46 % (ref 33–55)
CD4 T Cell Abs: 840 uL (ref 400–2700)

## 2011-01-03 ENCOUNTER — Other Ambulatory Visit: Payer: Self-pay

## 2011-01-17 ENCOUNTER — Encounter: Payer: Self-pay | Admitting: Infectious Diseases

## 2011-01-17 ENCOUNTER — Ambulatory Visit (INDEPENDENT_AMBULATORY_CARE_PROVIDER_SITE_OTHER): Payer: Managed Care, Other (non HMO) | Admitting: Infectious Diseases

## 2011-01-17 VITALS — BP 122/79 | HR 72 | Temp 98.9°F | Ht 68.0 in | Wt 225.0 lb

## 2011-01-17 DIAGNOSIS — B20 Human immunodeficiency virus [HIV] disease: Secondary | ICD-10-CM

## 2011-01-17 DIAGNOSIS — Z113 Encounter for screening for infections with a predominantly sexual mode of transmission: Secondary | ICD-10-CM

## 2011-01-17 DIAGNOSIS — Z23 Encounter for immunization: Secondary | ICD-10-CM

## 2011-01-17 DIAGNOSIS — Z79899 Other long term (current) drug therapy: Secondary | ICD-10-CM

## 2011-01-17 DIAGNOSIS — E785 Hyperlipidemia, unspecified: Secondary | ICD-10-CM

## 2011-01-17 NOTE — Assessment & Plan Note (Signed)
He is doing very well. Will cont his current meds. He gets flu shot today. Given condoms. rtc 6 months with labs prior.

## 2011-01-17 NOTE — Progress Notes (Signed)
  Subjective:    Patient ID: Riley Cortez, male    DOB: [birth year], 55 y.o.   MRN: [id number]  HPI  55 yo MHIV+. Last  CD4 840, VL <20 (01-01-11). Most recent lipids normal. Has been doing well.     Review of Systems     Objective:   Physical Exam  Constitutional: He appears well-developed and well-nourished.  Eyes: EOM are normal. Pupils are equal, round, and reactive to light.  Neck: Neck supple.  Cardiovascular: Normal rate, regular rhythm and normal heart sounds.   Pulmonary/Chest: Effort normal and breath sounds normal.  Abdominal: Soft. Bowel sounds are normal. He exhibits no distension.  Lymphadenopathy:    He has no cervical adenopathy.          Assessment & Plan:

## 2011-01-17 NOTE — Assessment & Plan Note (Signed)
Most recent labs show that this is well controlled. Will continue to watch

## 2011-01-23 LAB — T-HELPER CELL (CD4) - (RCID CLINIC ONLY): CD4 T Cell Abs: 760

## 2011-01-31 LAB — T-HELPER CELL (CD4) - (RCID CLINIC ONLY): CD4 % Helper T Cell: 41

## 2011-07-16 ENCOUNTER — Other Ambulatory Visit: Payer: Self-pay | Admitting: *Deleted

## 2011-07-16 DIAGNOSIS — B20 Human immunodeficiency virus [HIV] disease: Secondary | ICD-10-CM

## 2011-07-16 MED ORDER — EFAVIRENZ-EMTRICITAB-TENOFOVIR 600-200-300 MG PO TABS: 1.0000 | ORAL_TABLET | Freq: Every day | ORAL | Status: DC

## 2011-07-23 ENCOUNTER — Other Ambulatory Visit: Payer: Self-pay | Admitting: *Deleted

## 2011-07-23 DIAGNOSIS — B20 Human immunodeficiency virus [HIV] disease: Secondary | ICD-10-CM

## 2011-07-23 MED ORDER — EFAVIRENZ-EMTRICITAB-TENOFOVIR 600-200-300 MG PO TABS: 1.0000 | ORAL_TABLET | Freq: Every day | ORAL | Status: DC

## 2011-08-07 ENCOUNTER — Other Ambulatory Visit: Payer: Managed Care, Other (non HMO)

## 2011-08-07 DIAGNOSIS — Z79899 Other long term (current) drug therapy: Secondary | ICD-10-CM

## 2011-08-07 DIAGNOSIS — Z113 Encounter for screening for infections with a predominantly sexual mode of transmission: Secondary | ICD-10-CM

## 2011-08-07 DIAGNOSIS — B20 Human immunodeficiency virus [HIV] disease: Secondary | ICD-10-CM

## 2011-08-07 LAB — CBC WITH DIFFERENTIAL/PLATELET
Basophils Absolute: 0 10*3/uL (ref 0.0–0.1)
Eosinophils Relative: 1 % (ref 0–5)
HCT: 42.8 % (ref 39.0–52.0)
Hemoglobin: 14.5 g/dL (ref 13.0–17.0)
Lymphocytes Relative: 36 % (ref 12–46)
MCHC: 33.9 g/dL (ref 30.0–36.0)
MCV: 104.1 fL — ABNORMAL HIGH (ref 78.0–100.0)
Monocytes Absolute: 0.6 10*3/uL (ref 0.1–1.0)
Monocytes Relative: 11 % (ref 3–12)
RDW: 13.1 % (ref 11.5–15.5)
WBC: 5.7 10*3/uL (ref 4.0–10.5)

## 2011-08-07 LAB — COMPREHENSIVE METABOLIC PANEL
Alkaline Phosphatase: 46 U/L (ref 39–117)
BUN: 18 mg/dL (ref 6–23)
CO2: 25 mEq/L (ref 19–32)
Creat: 1.24 mg/dL (ref 0.50–1.35)
Glucose, Bld: 101 mg/dL — ABNORMAL HIGH (ref 70–99)
Total Bilirubin: 0.4 mg/dL (ref 0.3–1.2)
Total Protein: 5.4 g/dL — ABNORMAL LOW (ref 6.0–8.3)

## 2011-08-07 LAB — LIPID PANEL
Cholesterol: 163 mg/dL (ref 0–200)
HDL: 53 mg/dL (ref >39)
Total CHOL/HDL Ratio: 3.1 Ratio
Triglycerides: 116 mg/dL (ref <150)

## 2011-08-08 LAB — T-HELPER CELL (CD4) - (RCID CLINIC ONLY): CD4 % Helper T Cell: 46 % (ref 33–55)

## 2011-08-09 LAB — HIV-1 RNA QUANT-NO REFLEX-BLD
HIV 1 RNA Quant: <20 copies/mL (ref <20)
HIV-1 RNA Quant, Log: <1.3 {Log} (ref <1.30)

## 2011-08-22 ENCOUNTER — Ambulatory Visit (INDEPENDENT_AMBULATORY_CARE_PROVIDER_SITE_OTHER): Payer: Managed Care, Other (non HMO) | Admitting: Infectious Diseases

## 2011-08-22 ENCOUNTER — Encounter: Payer: Self-pay | Admitting: Infectious Diseases

## 2011-08-22 VITALS — BP 113/74 | HR 67 | Temp 99.1°F | Ht 68.0 in | Wt 229.0 lb

## 2011-08-22 DIAGNOSIS — B20 Human immunodeficiency virus [HIV] disease: Secondary | ICD-10-CM

## 2011-08-22 DIAGNOSIS — I259 Chronic ischemic heart disease, unspecified: Secondary | ICD-10-CM

## 2011-08-22 NOTE — Assessment & Plan Note (Signed)
He is doing well, lipid are in good control. He will continue to f/u with Dr Sharyn Lull, greatly appreciate his partnering with [place].

## 2011-08-22 NOTE — Assessment & Plan Note (Signed)
He is doing well. Will cont his current ART. encrouraged to exercise and watch diet. Wear seat belt. Return to clinic in 6 months with labs prior. Marland Kitchen

## 2011-08-22 NOTE — Progress Notes (Signed)
  Subjective:    Patient ID: Riley Cortez, male    DOB: 11-Apr-1956, 56 y.o.   MRN: [id number]  HPI 56 yo M with hx of HIV+, hyperlipidemia and HTN.   HIV 1 RNA Quant (copies/mL)  Date Value  08/07/2011 <20   01/01/2011 <20   12/19/2009 <48 copies/mL      CD4 T Cell Abs (cmm)  Date Value  08/07/2011 950   01/01/2011 840   12/19/2009 880     Lab Results  Component Value Date   CHOL 163 08/07/2011   HDL 53 08/07/2011   LDLCALC 87 08/07/2011   TRIG 116 08/07/2011   CHOLHDL 3.1 08/07/2011    Has been unable to loose his abd weight. Has been having 4-5 BM each AM but not diarrhea.  Colonoscopy done ~ 5 years ago.    Review of Systems  Constitutional: Negative for appetite change and unexpected weight change.  Gastrointestinal: Negative for diarrhea and constipation.  Genitourinary: Negative for dysuria.  Neurological: Negative for headaches.       Objective:   Physical Exam  Constitutional: He appears well-developed and well-nourished.  HENT:  Mouth/Throat: No oropharyngeal exudate.  Eyes: EOM are normal. Pupils are equal, round, and reactive to light.  Neck: Neck supple.  Cardiovascular: Normal rate, regular rhythm and normal heart sounds.   Pulmonary/Chest: Effort normal and breath sounds normal.  Abdominal: Soft. Bowel sounds are normal. There is no tenderness.  Lymphadenopathy:    He has no cervical adenopathy.          Assessment & Plan:

## 2012-01-22 DIAGNOSIS — G459 Transient cerebral ischemic attack, unspecified: Secondary | ICD-10-CM | POA: Insufficient documentation

## 2012-01-31 ENCOUNTER — Ambulatory Visit (HOSPITAL_COMMUNITY)
Admission: RE | Admit: 2012-01-31 | Discharge: 2012-01-31 | Disposition: A | Discharge status: Home / Self Care | Payer: Managed Care, Other (non HMO) | Source: Ambulatory Visit | Attending: Cardiology | Admitting: Cardiology

## 2012-01-31 VITALS — BP 127/79

## 2012-01-31 DIAGNOSIS — I240 Acute coronary thrombosis not resulting in myocardial infarction: Secondary | ICD-10-CM

## 2012-01-31 DIAGNOSIS — I319 Disease of pericardium, unspecified: Secondary | ICD-10-CM | POA: Insufficient documentation

## 2012-01-31 MED ORDER — PERFLUTREN LIPID MICROSPHERE
INTRAVENOUS | Status: AC
  Filled 2012-01-31: qty 10

## 2012-01-31 MED ORDER — PERFLUTREN LIPID MICROSPHERE
1.0000 mL | INTRAVENOUS | Status: AC | PRN
  Administered 2012-01-31: 2 mL via INTRAVENOUS

## 2012-01-31 NOTE — OR Nursing (Signed)
22 gauge IV started Rt hand.  2 cc of Definity IV given.  Denies back pain.  IV discontinued after Echo study.

## 2012-01-31 NOTE — Progress Notes (Signed)
*  PRELIMINARY RESULTS* Echocardiogram 2D Echocardiogram has been performed.  Jeryl Columbia 01/31/2012, 4:14 PM

## 2012-05-06 ENCOUNTER — Other Ambulatory Visit (INDEPENDENT_AMBULATORY_CARE_PROVIDER_SITE_OTHER): Payer: Managed Care, Other (non HMO)

## 2012-05-06 DIAGNOSIS — B20 Human immunodeficiency virus [HIV] disease: Secondary | ICD-10-CM

## 2012-05-06 LAB — CBC
HCT: 41.5 % (ref 39.0–52.0)
MCHC: 36.1 g/dL — ABNORMAL HIGH (ref 30.0–36.0)
MCV: 99 fL (ref 78.0–100.0)
RDW: 13.5 % (ref 11.5–15.5)
WBC: 5.7 10*3/uL (ref 4.0–10.5)

## 2012-05-06 LAB — COMPREHENSIVE METABOLIC PANEL
AST: 27 U/L (ref 0–37)
BUN: 20 mg/dL (ref 6–23)
Calcium: 8.8 mg/dL (ref 8.4–10.5)
Chloride: 110 mEq/L (ref 96–112)
Creat: 1.22 mg/dL (ref 0.50–1.35)

## 2012-05-21 ENCOUNTER — Ambulatory Visit (INDEPENDENT_AMBULATORY_CARE_PROVIDER_SITE_OTHER): Payer: Managed Care, Other (non HMO) | Admitting: Infectious Diseases

## 2012-05-21 ENCOUNTER — Encounter: Payer: Self-pay | Admitting: Infectious Diseases

## 2012-05-21 ENCOUNTER — Other Ambulatory Visit: Payer: Self-pay | Admitting: *Deleted

## 2012-05-21 VITALS — BP 115/78 | HR 80 | Temp 98.2°F | Ht 68.0 in | Wt 237.0 lb

## 2012-05-21 DIAGNOSIS — G459 Transient cerebral ischemic attack, unspecified: Secondary | ICD-10-CM

## 2012-05-21 DIAGNOSIS — B20 Human immunodeficiency virus [HIV] disease: Secondary | ICD-10-CM

## 2012-05-21 DIAGNOSIS — B009 Herpesviral infection, unspecified: Secondary | ICD-10-CM

## 2012-05-21 DIAGNOSIS — Z113 Encounter for screening for infections with a predominantly sexual mode of transmission: Secondary | ICD-10-CM

## 2012-05-21 DIAGNOSIS — Z79899 Other long term (current) drug therapy: Secondary | ICD-10-CM

## 2012-05-21 MED ORDER — VALACYCLOVIR HCL 1 G PO TABS: 2000.0000 mg | ORAL_TABLET | Freq: Two times a day (BID) | ORAL | Status: DC

## 2012-05-21 MED ORDER — VALACYCLOVIR HCL 1 G PO TABS: 1000.0000 mg | ORAL_TABLET | Freq: Two times a day (BID) | ORAL | Status: DC

## 2012-05-21 NOTE — Assessment & Plan Note (Signed)
Will refill VTX

## 2012-05-21 NOTE — Assessment & Plan Note (Signed)
Appears to have resolved and by his hx w/u was (-). appreciate Dr Annitta Jersey f/u.

## 2012-05-21 NOTE — Assessment & Plan Note (Signed)
Doing very well. Greatly appreciate Dr Annitta Jersey f/u. He will continue on his current meds. Given condoms. vax up to date. Will see him back in 6 months.

## 2012-05-21 NOTE — Progress Notes (Signed)
  Subjective:    Patient ID: Riley Cortez, male    DOB: [birth year], 57 y.o.   MRN: [id number]  HPI 57 yo M with hx of HIV+, hyperlipidemia, and HTN.  Had "mini-stroke" (TIA) in September in [place]. Had weakness of his L hand/arm. Did not have bleed on CT.  Had bubble study (-) and EF 45-05% on recent TTE. Has also been dx with basal cell CA on his leg. Will have removed in April 2014.     HIV 1 RNA Quant (copies/mL)  Date Value  05/06/2012 <20   08/07/2011 <20   01/01/2011 <20      CD4 T Cell Abs (cmm)  Date Value  05/06/2012 960   08/07/2011 950   01/01/2011 840        Review of Systems  Constitutional: Negative for appetite change and unexpected weight change.  Neurological: Negative for weakness and headaches.       Objective:   Physical Exam  Constitutional: He appears well-developed and well-nourished.  HENT:  Mouth/Throat: No oropharyngeal exudate.  Eyes: EOM are normal. Pupils are equal, round, and reactive to light.  Neck: Neck supple.  Cardiovascular: Normal rate, regular rhythm and normal heart sounds.   Pulmonary/Chest: Effort normal and breath sounds normal.  Abdominal: Soft. Bowel sounds are normal. There is no tenderness.  Lymphadenopathy:    He has no cervical adenopathy.          Assessment & Plan:

## 2012-11-11 ENCOUNTER — Other Ambulatory Visit: Payer: Self-pay | Admitting: *Deleted

## 2012-11-11 DIAGNOSIS — B20 Human immunodeficiency virus [HIV] disease: Secondary | ICD-10-CM

## 2012-11-11 MED ORDER — EFAVIRENZ-EMTRICITAB-TENOFOVIR 600-200-300 MG PO TABS: 1.0000 | ORAL_TABLET | Freq: Every day | ORAL | Status: DC

## 2012-11-12 ENCOUNTER — Other Ambulatory Visit: Payer: Self-pay | Admitting: *Deleted

## 2012-11-12 DIAGNOSIS — B20 Human immunodeficiency virus [HIV] disease: Secondary | ICD-10-CM

## 2012-11-12 MED ORDER — EFAVIRENZ-EMTRICITAB-TENOFOVIR 600-200-300 MG PO TABS: 1.0000 | ORAL_TABLET | Freq: Every day | ORAL | Status: DC

## 2012-12-18 ENCOUNTER — Telehealth: Payer: Self-pay | Admitting: *Deleted

## 2012-12-18 NOTE — Telephone Encounter (Signed)
Requested pt call [facility] to make lab work and MD appts.

## 2013-01-19 ENCOUNTER — Other Ambulatory Visit: Payer: Managed Care, Other (non HMO)

## 2013-01-19 DIAGNOSIS — B20 Human immunodeficiency virus [HIV] disease: Secondary | ICD-10-CM

## 2013-01-19 DIAGNOSIS — Z79899 Other long term (current) drug therapy: Secondary | ICD-10-CM

## 2013-01-19 DIAGNOSIS — Z113 Encounter for screening for infections with a predominantly sexual mode of transmission: Secondary | ICD-10-CM

## 2013-01-19 LAB — COMPREHENSIVE METABOLIC PANEL
ALT: 38 U/L (ref 0–53)
AST: 37 U/L (ref 0–37)
Albumin: 3.6 g/dL (ref 3.5–5.2)
Alkaline Phosphatase: 47 U/L (ref 39–117)
Potassium: 4.5 mEq/L (ref 3.5–5.3)
Sodium: 139 mEq/L (ref 135–145)
Total Protein: 5.4 g/dL — ABNORMAL LOW (ref 6.0–8.3)

## 2013-01-19 LAB — LIPID PANEL: LDL Cholesterol: 82 mg/dL (ref 0–99)

## 2013-01-20 LAB — T-HELPER CELL (CD4) - (RCID CLINIC ONLY): CD4 % Helper T Cell: 43 % (ref 33–55)

## 2013-01-20 LAB — CBC
Hemoglobin: 14.6 g/dL (ref 13.0–17.0)
MCHC: 35.7 g/dL (ref 30.0–36.0)
Platelets: 305 10*3/uL (ref 150–400)
RDW: 13.9 % (ref 11.5–15.5)

## 2013-01-20 LAB — HIV-1 RNA QUANT-NO REFLEX-BLD
HIV 1 RNA Quant: <20 copies/mL (ref <20)
HIV-1 RNA Quant, Log: <1.3 {Log} (ref <1.30)

## 2013-01-20 LAB — RPR

## 2013-01-26 ENCOUNTER — Ambulatory Visit: Payer: Managed Care, Other (non HMO) | Admitting: Infectious Diseases

## 2013-02-02 ENCOUNTER — Ambulatory Visit (INDEPENDENT_AMBULATORY_CARE_PROVIDER_SITE_OTHER): Payer: Managed Care, Other (non HMO) | Admitting: Infectious Diseases

## 2013-02-02 VITALS — Ht 68.0 in | Wt 236.5 lb

## 2013-02-02 DIAGNOSIS — Z79899 Other long term (current) drug therapy: Secondary | ICD-10-CM

## 2013-02-02 DIAGNOSIS — E785 Hyperlipidemia, unspecified: Secondary | ICD-10-CM

## 2013-02-02 DIAGNOSIS — Z113 Encounter for screening for infections with a predominantly sexual mode of transmission: Secondary | ICD-10-CM

## 2013-02-02 DIAGNOSIS — Z23 Encounter for immunization: Secondary | ICD-10-CM

## 2013-02-02 DIAGNOSIS — Z21 Asymptomatic human immunodeficiency virus [HIV] infection status: Secondary | ICD-10-CM

## 2013-02-02 DIAGNOSIS — I2510 Atherosclerotic heart disease of native coronary artery without angina pectoris: Secondary | ICD-10-CM

## 2013-02-02 DIAGNOSIS — B20 Human immunodeficiency virus [HIV] disease: Secondary | ICD-10-CM

## 2013-02-02 MED ORDER — EFAVIRENZ-EMTRICITAB-TENOFOVIR 600-200-300 MG PO TABS: 1.0000 | ORAL_TABLET | Freq: Every day | ORAL | Status: DC

## 2013-02-02 NOTE — Assessment & Plan Note (Signed)
He's doing very well. He has gotten flu and pnvx update. He wants to come back in 1 year which I think would be fine. He will get repeat colonoscopy next year.

## 2013-02-02 NOTE — Assessment & Plan Note (Signed)
Lipids well controlled. Given a copy of his labs to take to his cardiologist.

## 2013-02-02 NOTE — Assessment & Plan Note (Signed)
My great appreciation to Dr Sharyn Lull. Is asx

## 2013-02-02 NOTE — Addendum Note (Signed)
Addended by: Andree Coss on: 02/02/2013 05:15 PM   Modules accepted: Orders

## 2013-02-02 NOTE — Progress Notes (Signed)
  Subjective:    Patient ID: Riley Cortez, male    DOB: Feb 12, 1956, 57 y.o.   MRN: [id number]  HPI 57 yo M with hx of HIV+, hyperlipidemia, and HTN.  Prev TIA Sept 2013. No further issues.  Prev basal cell CA of R leg. Has been excised.   HIV 1 RNA Quant (copies/mL)  Date Value  01/19/2013 <20   05/06/2012 <20   08/07/2011 <20      CD4 T Cell Abs (/uL)  Date Value  01/19/2013 860   05/06/2012 960   08/07/2011 950    Review of Systems  Constitutional: Negative for appetite change and unexpected weight change.  Respiratory: Negative for shortness of breath.   Cardiovascular: Negative for chest pain.  Gastrointestinal: Negative for diarrhea and constipation.  Genitourinary: Negative for difficulty urinating.  Neurological: Negative for weakness and numbness.       Objective:   Physical Exam  Constitutional: He appears well-developed and well-nourished.  HENT:  Mouth/Throat: No oropharyngeal exudate.  Eyes: EOM are normal. Pupils are equal, round, and reactive to light.  Neck: Neck supple.  Cardiovascular: Normal rate, regular rhythm and normal heart sounds.   Pulmonary/Chest: Effort normal and breath sounds normal.  Abdominal: Soft. Bowel sounds are normal. There is no tenderness. There is no rebound.  Lymphadenopathy:    He has no cervical adenopathy.          Assessment & Plan:

## 2013-12-07 ENCOUNTER — Other Ambulatory Visit: Payer: Managed Care, Other (non HMO)

## 2013-12-07 DIAGNOSIS — Z79899 Other long term (current) drug therapy: Secondary | ICD-10-CM

## 2013-12-07 DIAGNOSIS — B20 Human immunodeficiency virus [HIV] disease: Secondary | ICD-10-CM

## 2013-12-07 DIAGNOSIS — Z113 Encounter for screening for infections with a predominantly sexual mode of transmission: Secondary | ICD-10-CM

## 2013-12-07 LAB — CBC WITH DIFFERENTIAL/PLATELET
Basophils Absolute: 0 10*3/uL (ref 0.0–0.1)
Basophils Relative: 0 % (ref 0–1)
EOS ABS: 0.1 10*3/uL (ref 0.0–0.7)
Eosinophils Relative: 1 % (ref 0–5)
HEMATOCRIT: 41.3 % (ref 39.0–52.0)
HEMOGLOBIN: 14.5 g/dL (ref 13.0–17.0)
LYMPHS ABS: 1.8 10*3/uL (ref 0.7–4.0)
Lymphocytes Relative: 29 % (ref 12–46)
MCH: 34.5 pg — AB (ref 26.0–34.0)
MCHC: 35.1 g/dL (ref 30.0–36.0)
MCV: 98.3 fL (ref 78.0–100.0)
MONO ABS: 0.8 10*3/uL (ref 0.1–1.0)
MONOS PCT: 12 % (ref 3–12)
NEUTROS PCT: 58 % (ref 43–77)
Neutro Abs: 3.7 10*3/uL (ref 1.7–7.7)
Platelets: 285 10*3/uL (ref 150–400)
RBC: 4.2 MIL/uL — AB (ref 4.22–5.81)
RDW: 13.5 % (ref 11.5–15.5)
WBC: 6.3 10*3/uL (ref 4.0–10.5)

## 2013-12-07 LAB — COMPLETE METABOLIC PANEL WITH GFR
ALBUMIN: 3.4 g/dL — AB (ref 3.5–5.2)
ALT: 33 U/L (ref 0–53)
AST: 37 U/L (ref 0–37)
Alkaline Phosphatase: 44 U/L (ref 39–117)
BILIRUBIN TOTAL: 0.3 mg/dL (ref 0.2–1.2)
BUN: 17 mg/dL (ref 6–23)
CO2: 27 meq/L (ref 19–32)
Calcium: 8.8 mg/dL (ref 8.4–10.5)
Chloride: 107 mEq/L (ref 96–112)
Creat: 1.11 mg/dL (ref 0.50–1.35)
GFR, EST AFRICAN AMERICAN: 83 mL/min
GFR, EST NON AFRICAN AMERICAN: 72 mL/min
GLUCOSE: 99 mg/dL (ref 70–99)
Potassium: 4.3 mEq/L (ref 3.5–5.3)
SODIUM: 139 meq/L (ref 135–145)
TOTAL PROTEIN: 5.2 g/dL — AB (ref 6.0–8.3)

## 2013-12-07 LAB — LIPID PANEL
CHOLESTEROL: 168 mg/dL (ref 0–200)
HDL: 51 mg/dL (ref >39)
LDL CALC: 91 mg/dL (ref 0–99)
Total CHOL/HDL Ratio: 3.3 Ratio
Triglycerides: 132 mg/dL (ref <150)
VLDL: 26 mg/dL (ref 0–40)

## 2013-12-08 LAB — T-HELPER CELL (CD4) - (RCID CLINIC ONLY)
CD4 T CELL ABS: 910 /uL (ref 400–2700)
CD4 T CELL HELPER: 46 % (ref 33–55)

## 2013-12-08 LAB — RPR

## 2013-12-09 LAB — HIV-1 RNA QUANT-NO REFLEX-BLD
HIV 1 RNA Quant: <20 copies/mL (ref <20)
HIV-1 RNA Quant, Log: <1.3 {Log} (ref <1.30)

## 2013-12-30 ENCOUNTER — Ambulatory Visit (INDEPENDENT_AMBULATORY_CARE_PROVIDER_SITE_OTHER): Payer: Managed Care, Other (non HMO) | Admitting: Infectious Diseases

## 2013-12-30 ENCOUNTER — Encounter: Payer: Self-pay | Admitting: Infectious Diseases

## 2013-12-30 VITALS — BP 121/72 | HR 75 | Temp 98.6°F | Ht 68.0 in | Wt 236.0 lb

## 2013-12-30 DIAGNOSIS — I259 Chronic ischemic heart disease, unspecified: Secondary | ICD-10-CM

## 2013-12-30 DIAGNOSIS — B20 Human immunodeficiency virus [HIV] disease: Secondary | ICD-10-CM

## 2013-12-30 DIAGNOSIS — Z23 Encounter for immunization: Secondary | ICD-10-CM

## 2013-12-30 DIAGNOSIS — E669 Obesity, unspecified: Secondary | ICD-10-CM

## 2013-12-30 DIAGNOSIS — Z113 Encounter for screening for infections with a predominantly sexual mode of transmission: Secondary | ICD-10-CM

## 2013-12-30 DIAGNOSIS — E785 Hyperlipidemia, unspecified: Secondary | ICD-10-CM

## 2013-12-30 NOTE — Assessment & Plan Note (Signed)
He is doing very well on his current ART. Will check his Hep B S Ab at next lab draw. He is given condoms. His vax are up to date, gets flu today. Will see him back in 1 year. Encouraged him to exercise. Will get him in for his 10 yr colonoscopy f/u.

## 2013-12-30 NOTE — Progress Notes (Signed)
   Subjective:    Patient ID: Riley Cortez, male    DOB: [birth year], 58 y.o.   MRN: [id number]  HPI 58 yo M with hx of HIV+, hyperlipidemia, and HTN.  Prev TIA Sept 2013. Prev basal cell CA of R leg. Has been excised.  Currently taking atripla.    HIV 1 RNA Quant (copies/mL)  Date Value  12/07/2013 <20   01/19/2013 <20   05/06/2012 <20      CD4 T Cell Abs (/uL)  Date Value  12/07/2013 910   01/19/2013 860   05/06/2012 960    Lab Results  Component Value Date   CHOL 168 12/07/2013   HDL 51 12/07/2013   LDLCALC 91 12/07/2013   TRIG 132 12/07/2013   CHOLHDL 3.3 12/07/2013      Review of Systems  Constitutional: Negative for appetite change and unexpected weight change.       Objective:   Physical Exam  Constitutional: He appears well-developed and well-nourished.  HENT:  Mouth/Throat: No oropharyngeal exudate.  Eyes: EOM are normal. Pupils are equal, round, and reactive to light.  Cardiovascular: Normal rate, regular rhythm and normal heart sounds.   Pulmonary/Chest: Effort normal and breath sounds normal.  Abdominal: Soft. Bowel sounds are normal. He exhibits no distension.  Lymphadenopathy:    He has no cervical adenopathy.          Assessment & Plan:

## 2013-12-30 NOTE — Assessment & Plan Note (Signed)
Well controlled on his current medications. LFTs normal.

## 2013-12-30 NOTE — Assessment & Plan Note (Signed)
Doing well, asx.

## 2013-12-30 NOTE — Assessment & Plan Note (Signed)
Encouraged him to exercise. He is watching his diet, however he travels a lot.

## 2014-03-16 ENCOUNTER — Other Ambulatory Visit: Payer: Self-pay | Admitting: *Deleted

## 2014-03-16 DIAGNOSIS — Z21 Asymptomatic human immunodeficiency virus [HIV] infection status: Secondary | ICD-10-CM

## 2014-03-16 DIAGNOSIS — B20 Human immunodeficiency virus [HIV] disease: Secondary | ICD-10-CM

## 2014-03-16 MED ORDER — EFAVIRENZ-EMTRICITAB-TENOFOVIR 600-200-300 MG PO TABS: 1.0000 | ORAL_TABLET | Freq: Every day | ORAL | Status: DC

## 2014-11-24 ENCOUNTER — Other Ambulatory Visit: Payer: Self-pay | Admitting: *Deleted

## 2014-11-24 DIAGNOSIS — B20 Human immunodeficiency virus [HIV] disease: Secondary | ICD-10-CM

## 2014-11-24 MED ORDER — EFAVIRENZ-EMTRICITAB-TENOFOVIR 600-200-300 MG PO TABS: 1.0000 | ORAL_TABLET | Freq: Every day | ORAL | Status: DC

## 2014-12-07 ENCOUNTER — Other Ambulatory Visit: Payer: Self-pay | Admitting: *Deleted

## 2014-12-07 DIAGNOSIS — B009 Herpesviral infection, unspecified: Secondary | ICD-10-CM

## 2014-12-07 MED ORDER — VALACYCLOVIR HCL 1 G PO TABS: 1000.0000 mg | ORAL_TABLET | Freq: Two times a day (BID) | ORAL | Status: DC

## 2014-12-17 ENCOUNTER — Encounter: Payer: Self-pay | Admitting: Gastroenterology

## 2014-12-28 ENCOUNTER — Encounter: Payer: Self-pay | Admitting: Gastroenterology

## 2015-01-10 ENCOUNTER — Other Ambulatory Visit: Payer: Managed Care, Other (non HMO)

## 2015-01-10 DIAGNOSIS — Z113 Encounter for screening for infections with a predominantly sexual mode of transmission: Secondary | ICD-10-CM

## 2015-01-10 DIAGNOSIS — E785 Hyperlipidemia, unspecified: Secondary | ICD-10-CM

## 2015-01-10 DIAGNOSIS — B20 Human immunodeficiency virus [HIV] disease: Secondary | ICD-10-CM

## 2015-01-10 LAB — COMPREHENSIVE METABOLIC PANEL
ALBUMIN: 3.5 g/dL — AB (ref 3.6–5.1)
ALK PHOS: 53 U/L (ref 40–115)
ALT: 46 U/L (ref 9–46)
AST: 50 U/L — ABNORMAL HIGH (ref 10–35)
BILIRUBIN TOTAL: 0.4 mg/dL (ref 0.2–1.2)
BUN: 17 mg/dL (ref 7–25)
CO2: 26 mmol/L (ref 20–31)
CREATININE: 1.05 mg/dL (ref 0.70–1.25)
Calcium: 8.5 mg/dL — ABNORMAL LOW (ref 8.6–10.3)
Chloride: 108 mmol/L (ref 98–110)
Glucose, Bld: 108 mg/dL — ABNORMAL HIGH (ref 65–99)
Potassium: 4.3 mmol/L (ref 3.5–5.3)
SODIUM: 143 mmol/L (ref 135–146)
TOTAL PROTEIN: 5.3 g/dL — AB (ref 6.1–8.1)

## 2015-01-10 LAB — CBC
HCT: 42.4 % (ref 39.0–52.0)
Hemoglobin: 15 g/dL (ref 13.0–17.0)
MCH: 35.7 pg — ABNORMAL HIGH (ref 26.0–34.0)
MCHC: 35.4 g/dL (ref 30.0–36.0)
MCV: 101 fL — ABNORMAL HIGH (ref 78.0–100.0)
MPV: 8.1 fL — ABNORMAL LOW (ref 8.6–12.4)
PLATELETS: 262 10*3/uL (ref 150–400)
RBC: 4.2 MIL/uL — ABNORMAL LOW (ref 4.22–5.81)
RDW: 13.3 % (ref 11.5–15.5)
WBC: 6.3 10*3/uL (ref 4.0–10.5)

## 2015-01-10 LAB — LIPID PANEL
CHOLESTEROL: 171 mg/dL (ref 125–200)
HDL: 52 mg/dL (ref >=40)
LDL CALC: 94 mg/dL (ref <130)
TRIGLYCERIDES: 124 mg/dL (ref <150)
Total CHOL/HDL Ratio: 3.3 Ratio (ref <=5.0)
VLDL: 25 mg/dL (ref <30)

## 2015-01-11 LAB — HEPATITIS B SURFACE ANTIBODY,QUALITATIVE: HEP B S AB: NEGATIVE

## 2015-01-11 LAB — URINE CYTOLOGY ANCILLARY ONLY
Chlamydia: NEGATIVE
NEISSERIA GONORRHEA: NEGATIVE

## 2015-01-11 LAB — RPR

## 2015-01-11 LAB — T-HELPER CELL (CD4) - (RCID CLINIC ONLY)
CD4 % Helper T Cell: 47 % (ref 33–55)
CD4 T CELL ABS: 990 /uL (ref 400–2700)

## 2015-01-12 LAB — HIV-1 RNA QUANT-NO REFLEX-BLD
HIV 1 RNA Quant: <20 {copies}/mL
HIV-1 RNA Quant, Log: <1.3 {Log}

## 2015-01-24 ENCOUNTER — Ambulatory Visit (INDEPENDENT_AMBULATORY_CARE_PROVIDER_SITE_OTHER): Payer: Managed Care, Other (non HMO) | Admitting: Infectious Diseases

## 2015-01-24 ENCOUNTER — Encounter: Payer: Self-pay | Admitting: Infectious Diseases

## 2015-01-24 VITALS — BP 140/83 | HR 67 | Temp 98.1°F | Wt 244.0 lb

## 2015-01-24 DIAGNOSIS — Z79899 Other long term (current) drug therapy: Secondary | ICD-10-CM

## 2015-01-24 DIAGNOSIS — Z23 Encounter for immunization: Secondary | ICD-10-CM | POA: Diagnosis not present

## 2015-01-24 DIAGNOSIS — Z113 Encounter for screening for infections with a predominantly sexual mode of transmission: Secondary | ICD-10-CM | POA: Diagnosis not present

## 2015-01-24 DIAGNOSIS — B20 Human immunodeficiency virus [HIV] disease: Secondary | ICD-10-CM | POA: Diagnosis not present

## 2015-01-24 DIAGNOSIS — G459 Transient cerebral ischemic attack, unspecified: Secondary | ICD-10-CM | POA: Diagnosis not present

## 2015-01-24 DIAGNOSIS — E669 Obesity, unspecified: Secondary | ICD-10-CM

## 2015-01-24 NOTE — Assessment & Plan Note (Addendum)
Will have colon,  meds going well.  Needs to restart Hep B series.  Will continue atripla, consider change to newer art. He is concerned about resistance mutations.  Offered/refused condoms.

## 2015-01-24 NOTE — Assessment & Plan Note (Signed)
Encouraged wt loss and exercise.

## 2015-01-24 NOTE — Progress Notes (Signed)
   Subjective:    Patient ID: Riley Cortez, male    DOB: 06/02/1955, 59 y.o.   MRN: [id number]  HPI  59 yo M with hx of HIV+, hyperlipidemia, and HTN.  Prev TIA Sept 2013. Prev basal cell CA of R leg. Has been excised.   HIV 1 RNA QUANT (copies/mL)  Date Value  01/10/2015 <20  12/07/2013 <20  01/19/2013 <20   CD4 T CELL ABS (/uL)  Date Value  01/10/2015 990  12/07/2013 910  01/19/2013 860   Has been feeling well.  Taking atripla,  noted a lump on the back of his neck "a while ago".  Has been seen by ophtho and noted to have thinning of optic nerve- had MRI- is having watchful waiting. Vision is unchanged.   Review of Systems  Constitutional: Negative for appetite change and unexpected weight change.  Gastrointestinal: Negative for diarrhea and constipation.  Genitourinary: Negative for difficulty urinating.       Objective:   Physical Exam  Constitutional: He appears well-developed and well-nourished.  Eyes: EOM are normal. Pupils are equal, round, and reactive to light.  Neck: Neck supple.  Cardiovascular: Normal rate, regular rhythm and normal heart sounds.   Pulmonary/Chest: Effort normal and breath sounds normal.  Abdominal: Soft. Bowel sounds are normal. There is no tenderness. There is no rebound.  Lymphadenopathy:    He has no cervical adenopathy.  Skin:             Assessment & Plan:

## 2015-01-24 NOTE — Assessment & Plan Note (Signed)
No further sx 

## 2015-02-22 ENCOUNTER — Ambulatory Visit (AMBULATORY_SURGERY_CENTER): Payer: Self-pay

## 2015-02-22 VITALS — Ht 68.0 in | Wt 250.4 lb

## 2015-02-22 DIAGNOSIS — Z1211 Encounter for screening for malignant neoplasm of colon: Secondary | ICD-10-CM

## 2015-02-22 MED ORDER — SUPREP BOWEL PREP KIT 17.5-3.13-1.6 GM/177ML PO SOLN: 1.0000 | Freq: Once | ORAL | Status: DC

## 2015-02-22 NOTE — Progress Notes (Signed)
No allergies to eggs or soy No diet/weight loss meds No home oxygen No past problems with anesthesia  Has email; has had colon before

## 2015-03-08 ENCOUNTER — Other Ambulatory Visit: Payer: Self-pay | Admitting: *Deleted

## 2015-03-08 ENCOUNTER — Ambulatory Visit (AMBULATORY_SURGERY_CENTER): Payer: Managed Care, Other (non HMO) | Admitting: Gastroenterology

## 2015-03-08 ENCOUNTER — Encounter: Payer: Self-pay | Admitting: Gastroenterology

## 2015-03-08 VITALS — BP 107/64 | HR 70 | Temp 97.2°F | Resp 19 | Ht 68.0 in | Wt 250.0 lb

## 2015-03-08 DIAGNOSIS — Z1211 Encounter for screening for malignant neoplasm of colon: Secondary | ICD-10-CM | POA: Diagnosis present

## 2015-03-08 DIAGNOSIS — B20 Human immunodeficiency virus [HIV] disease: Secondary | ICD-10-CM

## 2015-03-08 MED ORDER — EFAVIRENZ-EMTRICITAB-TENOFOVIR 600-200-300 MG PO TABS: 1.0000 | ORAL_TABLET | Freq: Every day | ORAL | Status: DC

## 2015-03-08 MED ORDER — SODIUM CHLORIDE 0.9 % IV SOLN: 500.0000 mL | INTRAVENOUS | Status: DC

## 2015-03-08 NOTE — Patient Instructions (Signed)
YOU HAD AN ENDOSCOPIC PROCEDURE TODAY AT [facility]:   Refer to the procedure report that was given to you for any specific questions about what was found during the examination.  If the procedure report does not answer your questions, please call your gastroenterologist to clarify.  If you requested that your care partner not be given the details of your procedure findings, then the procedure report has been included in a sealed envelope for you to review at your convenience later.  YOU SHOULD EXPECT: Some feelings of bloating in the abdomen. Passage of more gas than usual.  Walking can help get rid of the air that was put into your GI tract during the procedure and reduce the bloating. If you had a lower endoscopy (such as a colonoscopy or flexible sigmoidoscopy) you may notice spotting of blood in your stool or on the toilet paper. If you underwent a bowel prep for your procedure, you may not have a normal bowel movement for a few days.  Please Note:  You might notice some irritation and congestion in your nose or some drainage.  This is from the oxygen used during your procedure.  There is no need for concern and it should clear up in a day or so.  SYMPTOMS TO REPORT IMMEDIATELY:   Following upper endoscopy (EGD)  Vomiting of blood or coffee ground material  New chest pain or pain under the shoulder blades  Painful or persistently difficult swallowing  New shortness of breath  Fever of 100F or higher  Black, tarry-looking stools  For urgent or emergent issues, a gastroenterologist can be reached at any hour by calling [phone].   DIET: Your first meal following the procedure should be a small meal and then it is ok to progress to your normal diet. Heavy or fried foods are harder to digest and may make you feel nauseous or bloated.  Likewise, meals heavy in dairy and vegetables can increase bloating.  Drink plenty of fluids but you should avoid alcoholic beverages for  24 hours.  ACTIVITY:  You should plan to take it easy for the rest of today and you should NOT DRIVE or use heavy machinery until tomorrow (because of the sedation medicines used during the test).    FOLLOW UP: Our staff will call the number listed on your records the next business day following your procedure to check on you and address any questions or concerns that you may have regarding the information given to you following your procedure. If we do not reach you, we will leave a message.  However, if you are feeling well and you are not experiencing any problems, there is no need to return our call.  We will assume that you have returned to your regular daily activities without incident.  If any biopsies were taken you will be contacted by phone or by letter within the next 1-3 weeks.  Please call us at [phone] if you have not heard about the biopsies in 3 weeks.    SIGNATURES/CONFIDENTIALITY: You and/or your care partner have signed paperwork which will be entered into your electronic medical record.  These signatures attest to the fact that that the information above on your After Visit Summary has been reviewed and is understood.  Full responsibility of the confidentiality of this discharge information lies with you and/or your care-partner.  Next colonoscopy in 10 years.

## 2015-03-08 NOTE — Op Note (Signed)
[facility]  [address]. [place], [address]   COLONOSCOPY PROCEDURE REPORT  PATIENT: Riley Cortez, Riley Cortez  MR#: [id number] BIRTHDATE: 08/08/1955 , 71  yrs. old GENDER: male ENDOSCOPIST: Milus Banister, MD REFERRED [id number] Rankins, M.D. PROCEDURE DATE:  03/08/2015 PROCEDURE:   Colonoscopy, screening First Screening Colonoscopy - Avg.  risk and is 50 yrs.  old or older - No.  Prior Negative Screening - Now for repeat screening. 10 or more years since last screening  History of Adenoma - Now for follow-up colonoscopy & has been > or = to 3 yrs.  N/A  Recommend repeat exam, <10 yrs? No ASA CLASS:   Class II INDICATIONS:Screening for colonic neoplasia and Colorectal Neoplasm Risk Assessment for this procedure is average risk. MEDICATIONS: Monitored anesthesia care and Propofol 250 mg IV  DESCRIPTION OF PROCEDURE:   After the risks benefits and alternatives of the procedure were thoroughly explained, informed consent was obtained.  The digital rectal exam revealed no abnormalities of the rectum.   The LB [id number]  endoscope was introduced through the anus and advanced to the cecum, which was identified by both the appendix and ileocecal valve. No adverse events experienced.   The quality of the prep was good.  The instrument was then slowly withdrawn as the colon was fully examined. Estimated blood loss is zero unless otherwise noted in this procedure report.  COLON FINDINGS: A normal appearing cecum, ileocecal valve, and appendiceal orifice were identified.  The ascending, transverse, descending, sigmoid colon, and rectum appeared unremarkable. Retroflexed views revealed no abnormalities. The time to cecum = 1.9 Withdrawal time = 10.4   The scope was withdrawn and the procedure completed. COMPLICATIONS: There were no immediate complications.  ENDOSCOPIC IMPRESSION: Normal colonoscopy No polyps or cancers  RECOMMENDATIONS: You should continue to  follow colorectal cancer screening guidelines for "routine risk" patients with a repeat colonoscopy in 10 years.   eSigned:  Milus Banister, MD 03/08/2015 9:28 AM

## 2015-03-08 NOTE — Progress Notes (Signed)
Patient awakening,vss,report to rn 

## 2015-03-09 ENCOUNTER — Telehealth: Payer: Self-pay

## 2015-03-09 NOTE — Telephone Encounter (Signed)
  Follow up Call-  Call back number 03/08/2015  Post procedure Call Back phone  # [phone]  Permission to leave phone message Yes     Patient questions:  Do you have a fever, pain , or abdominal swelling? No. Pain Score  0 *  Have you tolerated food without any problems? Yes.    Have you been able to return to your normal activities? Yes.    Do you have any questions about your discharge instructions: Diet   No. Medications  No. Follow up visit  No.  Do you have questions or concerns about your Care? No.  Actions: * If pain score is 4 or above: No action needed, pain <4.   No problems per the pt. maw

## 2015-05-17 ENCOUNTER — Other Ambulatory Visit: Payer: Self-pay | Admitting: Infectious Diseases

## 2015-05-18 ENCOUNTER — Other Ambulatory Visit: Payer: Self-pay

## 2015-05-18 DIAGNOSIS — B009 Herpesviral infection, unspecified: Secondary | ICD-10-CM

## 2015-05-18 MED ORDER — VALACYCLOVIR HCL 1 G PO TABS: 2000.0000 mg | ORAL_TABLET | Freq: Two times a day (BID) | ORAL | Status: DC

## 2015-05-18 NOTE — Telephone Encounter (Signed)
Medication has not been refilled since 04/2012.  MD please advise about refilling and number of refills.

## 2016-01-09 ENCOUNTER — Other Ambulatory Visit: Payer: Managed Care, Other (non HMO)

## 2016-01-09 DIAGNOSIS — B20 Human immunodeficiency virus [HIV] disease: Secondary | ICD-10-CM

## 2016-01-09 DIAGNOSIS — Z113 Encounter for screening for infections with a predominantly sexual mode of transmission: Secondary | ICD-10-CM

## 2016-01-09 DIAGNOSIS — Z79899 Other long term (current) drug therapy: Secondary | ICD-10-CM

## 2016-01-09 LAB — CBC
HEMATOCRIT: 41.4 % (ref 38.5–50.0)
Hemoglobin: 14.3 g/dL (ref 13.2–17.1)
MCH: 35.3 pg — ABNORMAL HIGH (ref 27.0–33.0)
MCHC: 34.5 g/dL (ref 32.0–36.0)
MCV: 102.2 fL — AB (ref 80.0–100.0)
MPV: 8.2 fL (ref 7.5–12.5)
PLATELETS: 270 10*3/uL (ref 140–400)
RBC: 4.05 MIL/uL — ABNORMAL LOW (ref 4.20–5.80)
RDW: 13.5 % (ref 11.0–15.0)
WBC: 5.7 10*3/uL (ref 3.8–10.8)

## 2016-01-10 LAB — LIPID PANEL
CHOL/HDL RATIO: 2.7 ratio (ref <=5.0)
CHOLESTEROL: 162 mg/dL (ref 125–200)
HDL: 61 mg/dL (ref >=40)
LDL Cholesterol: 83 mg/dL (ref <130)
TRIGLYCERIDES: 92 mg/dL (ref <150)
VLDL: 18 mg/dL (ref <30)

## 2016-01-10 LAB — COMPREHENSIVE METABOLIC PANEL
ALT: 44 U/L (ref 9–46)
AST: 55 U/L — ABNORMAL HIGH (ref 10–35)
Albumin: 3.1 g/dL — ABNORMAL LOW (ref 3.6–5.1)
Alkaline Phosphatase: 48 U/L (ref 40–115)
BUN: 13 mg/dL (ref 7–25)
CALCIUM: 8.3 mg/dL — AB (ref 8.6–10.3)
CHLORIDE: 108 mmol/L (ref 98–110)
CO2: 24 mmol/L (ref 20–31)
Creat: 0.97 mg/dL (ref 0.70–1.25)
GLUCOSE: 130 mg/dL — AB (ref 65–99)
POTASSIUM: 4.3 mmol/L (ref 3.5–5.3)
Sodium: 140 mmol/L (ref 135–146)
Total Bilirubin: 0.3 mg/dL (ref 0.2–1.2)
Total Protein: 4.8 g/dL — ABNORMAL LOW (ref 6.1–8.1)

## 2016-01-10 LAB — T-HELPER CELL (CD4) - (RCID CLINIC ONLY)
CD4 T CELL ABS: 830 /uL (ref 400–2700)
CD4 T CELL HELPER: 45 % (ref 33–55)

## 2016-01-10 LAB — HIV-1 RNA QUANT-NO REFLEX-BLD: HIV-1 RNA Quant, Log: <1.3 Log copies/mL (ref <1.30)

## 2016-01-10 LAB — RPR

## 2016-01-23 ENCOUNTER — Ambulatory Visit (INDEPENDENT_AMBULATORY_CARE_PROVIDER_SITE_OTHER): Payer: Managed Care, Other (non HMO) | Admitting: Infectious Diseases

## 2016-01-23 ENCOUNTER — Encounter: Payer: Self-pay | Admitting: Infectious Diseases

## 2016-01-23 VITALS — BP 116/79 | HR 70 | Temp 98.2°F | Ht 68.0 in | Wt 250.5 lb

## 2016-01-23 DIAGNOSIS — Z23 Encounter for immunization: Secondary | ICD-10-CM

## 2016-01-23 DIAGNOSIS — Z113 Encounter for screening for infections with a predominantly sexual mode of transmission: Secondary | ICD-10-CM

## 2016-01-23 DIAGNOSIS — Z79899 Other long term (current) drug therapy: Secondary | ICD-10-CM | POA: Diagnosis not present

## 2016-01-23 DIAGNOSIS — B20 Human immunodeficiency virus [HIV] disease: Secondary | ICD-10-CM | POA: Diagnosis not present

## 2016-01-23 NOTE — Progress Notes (Signed)
Subjective:    Patient ID: Riley Cortez is a 60 y.o. male.  Chief Complaint:  ROS  Objective:  Physical Exam  Laboratory:   Assessment:    Plan:

## 2016-01-23 NOTE — Assessment & Plan Note (Signed)
He is doing well.  I assured him that he could continue to see [place] despite any change in insurance.  Also, we can help him with his medicaitons.  Will see him back in 1 year Gets flu shot.  Gets condoms.

## 2016-01-23 NOTE — Progress Notes (Signed)
   Subjective:    Patient ID: Riley Cortez, male    DOB: 11/16/1955, 60 y.o.   MRN: [id number]  HPI 60 yo M with hx of HIV+, hyperlipidemia, and HTN. Is on atripla, lipitor.   Prev TIA Sept 2013. Prev basal cell CA of R leg. Has been excised. Is getting forced retirement, will have 1 year gap between [facility] and benefits/cobra.   HIV 1 RNA Quant (copies/mL)  Date Value  01/09/2016 <20  01/10/2015 <20  12/07/2013 <20   CD4 T Cell Abs (/uL)  Date Value  01/09/2016 830  01/10/2015 990  12/07/2013 910    Review of Systems  Constitutional: Negative for appetite change and unexpected weight change.  Gastrointestinal: Negative for constipation and diarrhea.  Genitourinary: Negative for difficulty urinating.       Objective:   Physical Exam  Constitutional: He appears well-developed and well-nourished.  HENT:  Mouth/Throat: No oropharyngeal exudate.  Eyes: EOM are normal. Pupils are equal, round, and reactive to light.  Neck: Neck supple.  Cardiovascular: Normal rate, regular rhythm and normal heart sounds.   Pulmonary/Chest: Effort normal and breath sounds normal.  Abdominal: Soft. Bowel sounds are normal. There is no tenderness. There is no rebound.  Musculoskeletal: He exhibits no edema.  Lymphadenopathy:    He has no cervical adenopathy.       Assessment & Plan:

## 2016-01-31 ENCOUNTER — Other Ambulatory Visit: Payer: Self-pay | Admitting: Infectious Diseases

## 2016-01-31 DIAGNOSIS — B20 Human immunodeficiency virus [HIV] disease: Secondary | ICD-10-CM

## 2016-09-28 ENCOUNTER — Other Ambulatory Visit: Payer: Self-pay | Admitting: Infectious Diseases

## 2016-09-28 DIAGNOSIS — B20 Human immunodeficiency virus [HIV] disease: Secondary | ICD-10-CM

## 2017-01-23 ENCOUNTER — Other Ambulatory Visit: Payer: Managed Care, Other (non HMO)

## 2017-01-23 ENCOUNTER — Other Ambulatory Visit (HOSPITAL_COMMUNITY)
Admission: RE | Admit: 2017-01-23 | Discharge: 2017-01-23 | Disposition: A | Discharge status: Home / Self Care | Payer: Managed Care, Other (non HMO) | Source: Ambulatory Visit | Attending: Infectious Diseases | Admitting: Infectious Diseases

## 2017-01-23 DIAGNOSIS — Z113 Encounter for screening for infections with a predominantly sexual mode of transmission: Secondary | ICD-10-CM | POA: Insufficient documentation

## 2017-01-23 DIAGNOSIS — Z79899 Other long term (current) drug therapy: Secondary | ICD-10-CM

## 2017-01-23 DIAGNOSIS — B20 Human immunodeficiency virus [HIV] disease: Secondary | ICD-10-CM

## 2017-01-24 LAB — URINE CYTOLOGY ANCILLARY ONLY
Chlamydia: NEGATIVE
NEISSERIA GONORRHEA: NEGATIVE

## 2017-01-24 LAB — LIPID PANEL
CHOLESTEROL: 178 mg/dL (ref <200)
HDL: 62 mg/dL (ref >40)
LDL Cholesterol (Calc): 98 mg/dL (calc)
Non-HDL Cholesterol (Calc): 116 mg/dL (calc) (ref <130)
Total CHOL/HDL Ratio: 2.9 (calc) (ref <5.0)
Triglycerides: 85 mg/dL (ref <150)

## 2017-01-24 LAB — COMPREHENSIVE METABOLIC PANEL
AG RATIO: 1.9 (calc) (ref 1.0–2.5)
ALBUMIN MSPROF: 3.4 g/dL — AB (ref 3.6–5.1)
ALKALINE PHOSPHATASE (APISO): 49 U/L (ref 40–115)
ALT: 44 U/L (ref 9–46)
AST: 44 U/L — AB (ref 10–35)
BILIRUBIN TOTAL: 0.5 mg/dL (ref 0.2–1.2)
BUN: 16 mg/dL (ref 7–25)
CALCIUM: 8.4 mg/dL — AB (ref 8.6–10.3)
CHLORIDE: 108 mmol/L (ref 98–110)
CO2: 25 mmol/L (ref 20–32)
Creat: 1.03 mg/dL (ref 0.70–1.25)
GLOBULIN: 1.8 g/dL — AB (ref 1.9–3.7)
Glucose, Bld: 107 mg/dL — ABNORMAL HIGH (ref 65–99)
POTASSIUM: 4.5 mmol/L (ref 3.5–5.3)
Sodium: 140 mmol/L (ref 135–146)
Total Protein: 5.2 g/dL — ABNORMAL LOW (ref 6.1–8.1)

## 2017-01-24 LAB — CBC
HCT: 40.9 % (ref 38.5–50.0)
HEMOGLOBIN: 14.9 g/dL (ref 13.2–17.1)
MCH: 36.7 pg — ABNORMAL HIGH (ref 27.0–33.0)
MCHC: 36.4 g/dL — AB (ref 32.0–36.0)
MCV: 100.7 fL — ABNORMAL HIGH (ref 80.0–100.0)
MPV: 8.4 fL (ref 7.5–12.5)
PLATELETS: 284 10*3/uL (ref 140–400)
RBC: 4.06 10*6/uL — ABNORMAL LOW (ref 4.20–5.80)
RDW: 12.4 % (ref 11.0–15.0)
WBC: 5.1 10*3/uL (ref 3.8–10.8)

## 2017-01-24 LAB — RPR: RPR: NONREACTIVE

## 2017-01-24 LAB — T-HELPER CELL (CD4) - (RCID CLINIC ONLY)
CD4 % Helper T Cell: 39 % (ref 33–55)
CD4 T CELL ABS: 730 /uL (ref 400–2700)

## 2017-01-30 LAB — HIV-1 RNA QUANT-NO REFLEX-BLD
HIV 1 RNA Quant: <20 copies/mL
HIV-1 RNA Quant, Log: <1.3 Log copies/mL

## 2017-02-06 ENCOUNTER — Encounter: Payer: Self-pay | Admitting: Infectious Diseases

## 2017-02-06 ENCOUNTER — Ambulatory Visit (INDEPENDENT_AMBULATORY_CARE_PROVIDER_SITE_OTHER): Payer: Managed Care, Other (non HMO) | Admitting: Infectious Diseases

## 2017-02-06 VITALS — BP 139/81 | HR 70 | Temp 98.5°F | Wt 255.0 lb

## 2017-02-06 DIAGNOSIS — Z23 Encounter for immunization: Secondary | ICD-10-CM | POA: Diagnosis not present

## 2017-02-06 DIAGNOSIS — E669 Obesity, unspecified: Secondary | ICD-10-CM

## 2017-02-06 DIAGNOSIS — Z113 Encounter for screening for infections with a predominantly sexual mode of transmission: Secondary | ICD-10-CM | POA: Diagnosis not present

## 2017-02-06 DIAGNOSIS — E785 Hyperlipidemia, unspecified: Secondary | ICD-10-CM | POA: Diagnosis not present

## 2017-02-06 DIAGNOSIS — B20 Human immunodeficiency virus [HIV] disease: Secondary | ICD-10-CM

## 2017-02-06 DIAGNOSIS — I259 Chronic ischemic heart disease, unspecified: Secondary | ICD-10-CM

## 2017-02-06 NOTE — Progress Notes (Signed)
   Subjective:    Patient ID: Riley Cortez, male    DOB: Feb 21, 1956, 61 y.o.   MRN: [id number]  HPI 61 yo M with hx of HIV+, hyperlipidemia, and HTN. Is on atripla, lipitor.   Prev TIA Sept 2013. Prev basal cell CA of R leg. Has been excised.  HIV 1 RNA Quant (copies/mL)  Date Value  01/23/2017 <20 NOT DETECTED  01/09/2016 <20  01/10/2015 <20   CD4 T Cell Abs (/uL)  Date Value  01/23/2017 730  01/09/2016 830  01/10/2015 990   Lab Results  Component Value Date   CHOL 178 01/23/2017   HDL 62 01/23/2017   LDLCALC 83 01/09/2016   TRIG 85 01/23/2017   CHOLHDL 2.9 01/23/2017     Needs help with insurance exchange, will start medicare July 2020.  Gets annual skin ca exam. Had vision exam this year.  Joined a gym.   Review of Systems  Constitutional: Negative for appetite change, chills, fever and unexpected weight change.  Eyes: Negative for visual disturbance.  Respiratory: Negative for shortness of breath.   Cardiovascular: Negative for chest pain.  Gastrointestinal: Negative for constipation and diarrhea.  Genitourinary: Negative for difficulty urinating.  Neurological: Negative for headaches.  Psychiatric/Behavioral: Negative for dysphoric mood and sleep disturbance.  Please see HPI. All other systems reviewed and negative.      Objective:   Physical Exam  Constitutional: He appears well-developed and well-nourished.  HENT:  Mouth/Throat: No oropharyngeal exudate.  Eyes: Pupils are equal, round, and reactive to light. EOM are normal.  Neck: Neck supple.  Cardiovascular: Normal rate, regular rhythm and normal heart sounds.   Pulmonary/Chest: Effort normal and breath sounds normal.  Abdominal: Soft. Bowel sounds are normal. There is no tenderness. There is no rebound.  Musculoskeletal: He exhibits edema.  Lymphadenopathy:    He has no cervical adenopathy.  Psychiatric: He has a normal mood and affect.      Assessment & Plan:

## 2017-02-06 NOTE — Assessment & Plan Note (Signed)
Follows Dr Terrence Dupont, On ASA, ACE, statin.  Needs beta blocker?

## 2017-02-06 NOTE — Assessment & Plan Note (Signed)
Watching diet, exercising.

## 2017-02-06 NOTE — Assessment & Plan Note (Signed)
Doing well on statin,  LFTS normal.

## 2017-02-06 NOTE — Assessment & Plan Note (Addendum)
Doing well Flu shot today.  Has had colon Will get him in with [employer]  for insurance assistance.  Will give him samples of atripla- symfilo 6 bottles (Lot # [id number], exp Nov 2019, Serial #s [id number]) rtc July 2019

## 2017-04-29 ENCOUNTER — Other Ambulatory Visit: Payer: Self-pay | Admitting: *Deleted

## 2017-04-29 DIAGNOSIS — B20 Human immunodeficiency virus [HIV] disease: Secondary | ICD-10-CM

## 2017-04-29 MED ORDER — EFAVIRENZ-EMTRICITAB-TENOFOVIR 600-200-300 MG PO TABS: 1.0000 | ORAL_TABLET | Freq: Every day | ORAL | 2 refills | Status: DC

## 2017-09-03 ENCOUNTER — Other Ambulatory Visit: Payer: Self-pay | Admitting: Infectious Diseases

## 2017-09-03 DIAGNOSIS — B009 Herpesviral infection, unspecified: Secondary | ICD-10-CM

## 2017-09-30 ENCOUNTER — Other Ambulatory Visit: Payer: Managed Care, Other (non HMO)

## 2017-09-30 ENCOUNTER — Other Ambulatory Visit (HOSPITAL_COMMUNITY)
Admission: RE | Admit: 2017-09-30 | Discharge: 2017-09-30 | Disposition: A | Discharge status: Home / Self Care | Payer: Managed Care, Other (non HMO) | Source: Ambulatory Visit | Attending: Infectious Diseases | Admitting: Infectious Diseases

## 2017-09-30 DIAGNOSIS — Z113 Encounter for screening for infections with a predominantly sexual mode of transmission: Secondary | ICD-10-CM | POA: Diagnosis not present

## 2017-09-30 DIAGNOSIS — B20 Human immunodeficiency virus [HIV] disease: Secondary | ICD-10-CM

## 2017-09-30 DIAGNOSIS — E785 Hyperlipidemia, unspecified: Secondary | ICD-10-CM

## 2017-09-30 DIAGNOSIS — I259 Chronic ischemic heart disease, unspecified: Secondary | ICD-10-CM

## 2017-10-01 LAB — COMPREHENSIVE METABOLIC PANEL
AG Ratio: 2 (calc) (ref 1.0–2.5)
ALKALINE PHOSPHATASE (APISO): 53 U/L (ref 40–115)
ALT: 49 U/L — ABNORMAL HIGH (ref 9–46)
AST: 63 U/L — AB (ref 10–35)
Albumin: 3.4 g/dL — ABNORMAL LOW (ref 3.6–5.1)
BUN: 11 mg/dL (ref 7–25)
CHLORIDE: 108 mmol/L (ref 98–110)
CO2: 27 mmol/L (ref 20–32)
CREATININE: 1.05 mg/dL (ref 0.70–1.25)
Calcium: 8.4 mg/dL — ABNORMAL LOW (ref 8.6–10.3)
GLOBULIN: 1.7 g/dL — AB (ref 1.9–3.7)
GLUCOSE: 106 mg/dL — AB (ref 65–99)
Potassium: 4.8 mmol/L (ref 3.5–5.3)
Sodium: 140 mmol/L (ref 135–146)
Total Bilirubin: 0.4 mg/dL (ref 0.2–1.2)
Total Protein: 5.1 g/dL — ABNORMAL LOW (ref 6.1–8.1)

## 2017-10-01 LAB — LIPID PANEL
CHOL/HDL RATIO: 3.4 (calc) (ref <5.0)
Cholesterol: 182 mg/dL (ref <200)
HDL: 54 mg/dL (ref >40)
LDL CHOLESTEROL (CALC): 114 mg/dL — AB
NON-HDL CHOLESTEROL (CALC): 128 mg/dL (ref <130)
TRIGLYCERIDES: 62 mg/dL (ref <150)

## 2017-10-01 LAB — CBC
HEMATOCRIT: 41.1 % (ref 38.5–50.0)
Hemoglobin: 14.8 g/dL (ref 13.2–17.1)
MCH: 36 pg — ABNORMAL HIGH (ref 27.0–33.0)
MCHC: 36 g/dL (ref 32.0–36.0)
MCV: 100 fL (ref 80.0–100.0)
MPV: 8.5 fL (ref 7.5–12.5)
Platelets: 257 10*3/uL (ref 140–400)
RBC: 4.11 10*6/uL — ABNORMAL LOW (ref 4.20–5.80)
RDW: 12.5 % (ref 11.0–15.0)
WBC: 5 10*3/uL (ref 3.8–10.8)

## 2017-10-01 LAB — T-HELPER CELL (CD4) - (RCID CLINIC ONLY)
CD4 T CELL HELPER: 38 % (ref 33–55)
CD4 T Cell Abs: 560 /uL (ref 400–2700)

## 2017-10-01 LAB — URINE CYTOLOGY ANCILLARY ONLY
Chlamydia: NEGATIVE
NEISSERIA GONORRHEA: NEGATIVE

## 2017-10-01 LAB — RPR: RPR: NONREACTIVE

## 2017-10-02 LAB — HIV-1 RNA QUANT-NO REFLEX-BLD
HIV 1 RNA QUANT: 24 {copies}/mL — AB
HIV-1 RNA QUANT, LOG: 1.38 {Log_copies}/mL — AB

## 2017-10-14 ENCOUNTER — Encounter: Payer: Self-pay | Admitting: Infectious Diseases

## 2017-10-14 ENCOUNTER — Ambulatory Visit: Payer: Managed Care, Other (non HMO) | Admitting: Infectious Diseases

## 2017-10-14 VITALS — BP 128/78 | HR 65 | Temp 97.8°F | Wt 251.0 lb

## 2017-10-14 DIAGNOSIS — Z113 Encounter for screening for infections with a predominantly sexual mode of transmission: Secondary | ICD-10-CM

## 2017-10-14 DIAGNOSIS — R600 Localized edema: Secondary | ICD-10-CM | POA: Diagnosis not present

## 2017-10-14 DIAGNOSIS — R609 Edema, unspecified: Secondary | ICD-10-CM | POA: Insufficient documentation

## 2017-10-14 DIAGNOSIS — Z23 Encounter for immunization: Secondary | ICD-10-CM

## 2017-10-14 DIAGNOSIS — E785 Hyperlipidemia, unspecified: Secondary | ICD-10-CM | POA: Diagnosis not present

## 2017-10-14 DIAGNOSIS — E669 Obesity, unspecified: Secondary | ICD-10-CM | POA: Diagnosis not present

## 2017-10-14 DIAGNOSIS — B20 Human immunodeficiency virus [HIV] disease: Secondary | ICD-10-CM | POA: Diagnosis not present

## 2017-10-14 MED ORDER — PNEUMOCOCCAL 13-VAL CONJ VACC IM SUSP
0.5000 mL | INTRAMUSCULAR | Status: AC
  Administered 2017-10-14: 0.5 mL via INTRAMUSCULAR

## 2017-10-14 NOTE — Addendum Note (Signed)
Addended by: Aundria Rud on: 10/14/2017 11:59 AM   Modules accepted: Orders

## 2017-10-14 NOTE — Assessment & Plan Note (Signed)
r ankle edema post injury many years ago.  Will check doppler, he is not immobile.   Will have him seen by [facility] after this.

## 2017-10-14 NOTE — Progress Notes (Signed)
   Subjective:    Patient ID: Riley Cortez, male    DOB: [birth year], 62 y.o.   MRN: [id number]  HPI 62yo M with hx of HIV+, hyperlipidemia, and HTN. Is on atripla, lipitor.  Prev TIA Sept 2013. Prev basal cell CA of R leg exceised.  Now retired, has been doing well.  Is working on getting into Pensions consultant.  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  Has surgery on back of L neck, fatty tumor. Last week (10-07-17). Wound healed well.   HIV 1 RNA Quant (copies/mL)  Date Value  09/30/2017 24 (H)  01/23/2017 <20 NOT DETECTED  01/09/2016 <20   CD4 T Cell Abs (/uL)  Date Value  09/30/2017 560  01/23/2017 730  01/09/2016 830    Review of Systems  Constitutional: Negative for appetite change and unexpected weight change.  Respiratory: Negative for cough and shortness of breath.   Gastrointestinal: Negative for constipation and diarrhea.  Genitourinary: Negative for difficulty urinating.  Psychiatric/Behavioral: Negative for dysphoric mood and sleep disturbance.  Please see HPI. All other systems reviewed and negative.      Objective:   Physical Exam  Constitutional: He is oriented to person, place, and time. He appears well-developed and well-nourished.  HENT:  Mouth/Throat: No oropharyngeal exudate.  Eyes: Pupils are equal, round, and reactive to light. EOM are normal.  Neck: Normal range of motion. Neck supple.  Cardiovascular: Normal rate, regular rhythm and normal heart sounds.  Pulmonary/Chest: Effort normal and breath sounds normal.  Abdominal: Soft. Bowel sounds are normal. There is no tenderness. There is no guarding.  Musculoskeletal: Normal range of motion.       Feet:  Lymphadenopathy:    He has no cervical adenopathy.  Neurological: He is alert and oriented to person, place, and time.  Skin:     Psychiatric: He has a normal mood and affect.       Assessment & Plan:

## 2017-10-14 NOTE — Assessment & Plan Note (Signed)
Working on diet/exercise

## 2017-10-14 NOTE — Assessment & Plan Note (Addendum)
Doing well Continue atripla.  prevnar Will see him back in 9 months.  Will get shingrix at PCP or pharmacy.

## 2017-10-15 ENCOUNTER — Telehealth: Payer: Self-pay | Admitting: *Deleted

## 2017-10-15 ENCOUNTER — Other Ambulatory Visit: Payer: Self-pay | Admitting: Infectious Diseases

## 2017-10-15 ENCOUNTER — Ambulatory Visit (HOSPITAL_COMMUNITY)
Admission: RE | Admit: 2017-10-15 | Discharge: 2017-10-15 | Disposition: A | Discharge status: Home / Self Care | Payer: Managed Care, Other (non HMO) | Source: Ambulatory Visit | Attending: Vascular Surgery | Admitting: Vascular Surgery

## 2017-10-15 DIAGNOSIS — R600 Localized edema: Secondary | ICD-10-CM | POA: Diagnosis not present

## 2017-10-15 NOTE — Telephone Encounter (Signed)
Tech from imaging center called to report that the patient is negative for DVT on doppler performed today and wanted to let Johnnye Sima know results are in the system.

## 2017-10-15 NOTE — Telephone Encounter (Signed)
Thank you :)

## 2017-10-17 ENCOUNTER — Other Ambulatory Visit: Payer: Self-pay

## 2017-10-17 DIAGNOSIS — R600 Localized edema: Secondary | ICD-10-CM

## 2017-12-03 ENCOUNTER — Ambulatory Visit (HOSPITAL_COMMUNITY)
Admission: RE | Admit: 2017-12-03 | Discharge: 2017-12-03 | Disposition: A | Discharge status: Home / Self Care | Payer: BLUE CROSS/BLUE SHIELD | Source: Ambulatory Visit | Attending: Vascular Surgery | Admitting: Vascular Surgery

## 2017-12-03 ENCOUNTER — Encounter: Payer: Self-pay | Admitting: Vascular Surgery

## 2017-12-03 ENCOUNTER — Ambulatory Visit: Payer: BLUE CROSS/BLUE SHIELD | Admitting: Vascular Surgery

## 2017-12-03 VITALS — BP 113/70 | HR 72 | Temp 97.4°F | Resp 18 | Ht 68.0 in | Wt 252.0 lb

## 2017-12-03 DIAGNOSIS — R936 Abnormal findings on diagnostic imaging of limbs: Secondary | ICD-10-CM | POA: Insufficient documentation

## 2017-12-03 DIAGNOSIS — R600 Localized edema: Secondary | ICD-10-CM | POA: Diagnosis present

## 2017-12-03 NOTE — Progress Notes (Signed)
[facility] [place]  Patient name: Riley Cortez MRN: [id number] DOB: 1956-02-15 Sex: male  REASON FOR CONSULT: Evaluation right leg foot and ankle swelling  HPI: Riley Cortez is a 62 y.o. male, who is here today for evaluation of swelling in his right leg.  He reports is been present for proximally 3 years and has had some mild progression over that time.  He does not have any history of DVT.  Worse this is worse [place] he is here in [place].  He does report that it is progressive throughout the day when he is having swelling.  Has never had any evidence of venous ulceration or skin changes.  Past Medical History:  Diagnosis Date  . Cancer ([place])    skin  . HIV infection ([place])   . Hyperlipidemia   . Myocardial infarct [facility])     Family History  Problem Relation Age of Onset  . Alzheimer's disease Mother   . Heart disease Father   . Factor V Leiden deficiency Brother   . Colon polyps Sister     SOCIAL HISTORY: Social History   Socioeconomic History  . Marital status: Single    Spouse name: Not on file  . Number of children: Not on file  . Years of education: Not on file  . Highest education level: Not on file  Occupational History  . Not on file  Social Needs  . Financial resource strain: Not on file  . Food insecurity:    Worry: Not on file    Inability: Not on file  . Transportation needs:    Medical: Not on file    Non-medical: Not on file  Tobacco Use  . Smoking status: Never Smoker  . Smokeless tobacco: Never Used  Substance and Sexual Activity  . Alcohol use: Yes    Alcohol/week: 0.0 standard drinks    Comment: socially  . Drug use: No  . Sexual activity: Yes    Partners: Male    Comment: pt. given condoms  Lifestyle  . Physical activity:    Days per week: Not on file    Minutes per session: Not on file  . Stress: Not on file    Relationships  . Social connections:    Talks on phone: Not on file    Gets together: Not on file    Attends religious service: Not on file    Active member of club or organization: Not on file    Attends meetings of clubs or organizations: Not on file    Relationship status: Not on file  . Intimate partner violence:    Fear of current or ex partner: Not on file    Emotionally abused: Not on file    Physically abused: Not on file    Forced sexual activity: Not on file  Other Topics Concern  . Not on file  Social History Narrative  . Not on file    No Known Allergies  Current Outpatient Medications  Medication Sig Dispense Refill  . aspirin 325 MG tablet Take 325 mg by mouth daily.    Marland Kitchen atorvastatin (LIPITOR) 80 MG tablet Take 80 mg by mouth at bedtime.      Marland Kitchen b complex vitamins capsule      . Choline Fenofibrate (TRILIPIX) 135 MG capsule Take 135 mg by mouth daily.      Marland Kitchen efavirenz-emtricitabine-tenofovir (ATRIPLA) 600-200-300 MG tablet Take  1 tablet by mouth at bedtime. 90 tablet 2  . fish oil-omega-3 fatty acids 1000 MG capsule Take 1 g by mouth 2 (two) times daily.      . folic acid (FOLVITE) 1 MG tablet Take 1 mg by mouth 2 (two) times daily.      . metoprolol succinate (TOPROL-XL) 25 MG 24 hr tablet Take 25 mg by mouth daily.      . Multiple Vitamins-Minerals (MULTIVITAMIN WITH MINERALS) tablet Take by mouth.    . ramipril (ALTACE) 5 MG capsule Take 5 mg by mouth daily.      . valACYclovir (VALTREX) 1000 MG tablet TAKE 2 TABLETS BY MOUTH TWICE DAILY FOR 5 DAYS AT OUTSET OF FEVER BLISTERS 20 tablet 5   No current facility-administered medications for this visit.     REVIEW OF SYSTEMS:  [X]  denotes positive finding, [ ]  denotes negative finding Cardiac  Comments:  Chest pain or chest pressure:    Shortness of breath upon exertion:    Short of breath when lying flat:    Irregular heart rhythm:        Vascular    Pain in calf, thigh, or hip brought on by ambulation:     Pain in feet at night that wakes you up from your sleep:     Blood clot in your veins:    Leg swelling:  x       Pulmonary    Oxygen at home:    Productive cough:     Wheezing:         Neurologic    Sudden weakness in arms or legs:     Sudden numbness in arms or legs:     Sudden onset of difficulty speaking or slurred speech:    Temporary loss of vision in one eye:     Problems with dizziness:         Gastrointestinal    Blood in stool:     Vomited blood:         Genitourinary    Burning when urinating:     Blood in urine:        Psychiatric    Major depression:         Hematologic    Bleeding problems:    Problems with blood clotting too easily:        Skin    Rashes or ulcers:        Constitutional    Fever or chills:      PHYSICAL EXAM: Vitals:   12/03/17 1330  BP: 113/70  Pulse: 72  Resp: 18  Temp: (!) 97.4 F (36.3 C)  TempSrc: Oral  SpO2: 95%  Weight: 252 lb (114.3 kg)  Height: 5\' 8"  (1.727 m)    GENERAL: The patient is a well-nourished male, in no acute distress. The vital signs are documented above. CARDIOVASCULAR: Plus dorsalis pedis pulses bilaterally.  He does have mild pitting edema in his left ankle and more pronounced in his right ankle.  He does not have any swelling onto his right foot or toes today. PULMONARY: There is good air exchange  ABDOMEN: Soft and non-tender  MUSCULOSKELETAL: There are no major deformities or cyanosis. NEUROLOGIC: No focal weakness or paresthesias are detected. SKIN: There are no ulcers or rashes noted. PSYCHIATRIC: The patient has a normal affect.  DATA:  He had gone prior noninvasive studies to rule out DVT which was negative.  Today he underwent a reflux study on his right leg.  This showed very mild reflux and a segment of his great saphenous vein in the proximal calf.  Otherwise no evidence of reflux.  His saphenous vein is not dilated.  MEDICAL ISSUES: I reassured the patient there is no danger  associated with his swelling.  I suspect that he does have a component of lymphedema since by history this does extend out onto his toes as well.  I explained the conservative treatment of this which would be elevation with his foot higher than his heart when possible and also 20 to 30 mm knee-high graduated compression garments.  He reports that this is currently mildly affecting him and will defer compression usage at this time.  I did explain that this will typically progress over the years and would consider compression if he begins to have more difficulty with this.  He was reassured with this discussion will see [place] again as needed   Rosetta Posner, MD [facility] Tel [phone] Pager [phone]

## 2018-03-10 ENCOUNTER — Other Ambulatory Visit: Payer: Self-pay | Admitting: Infectious Diseases

## 2018-03-10 DIAGNOSIS — B20 Human immunodeficiency virus [HIV] disease: Secondary | ICD-10-CM

## 2018-03-10 DIAGNOSIS — Z21 Asymptomatic human immunodeficiency virus [HIV] infection status: Secondary | ICD-10-CM

## 2018-08-20 ENCOUNTER — Telehealth: Payer: Self-pay

## 2018-08-20 ENCOUNTER — Telehealth: Payer: Self-pay | Admitting: Pharmacy Technician

## 2018-08-20 NOTE — Telephone Encounter (Signed)
-----   Message from Campbell Riches, MD sent at 08/20/2018  1:36 PM EDT ----- Regarding: RE: Patient calls Symfilo would be best Thanks jeff ----- Message ----- From: Eugenia Mcalpine, LPN Sent: 8/00/3491  10:21 AM EDT To: Campbell Riches, MD Subject: Patient calls                                  Patient called asking about his ART regimen.  He is preparing to fill out his medicare application. States he was previously taking Atripla but for the past six months he has been taking Symfilo( not listed on medication profile or in recent notes) Patient wanted to know which medication would you you prefer he take. States Symfilo would be cheaper. Please advise. ~Thanks

## 2018-08-20 NOTE — Telephone Encounter (Signed)
Mr. Amrhein called today concerned about his future large copay for his HIV medication  with new Medicare insurance.  It will be active in July 2020 and he will call me so that I can help him apply through [facility] [place].  He has my phone number at the clinic.  Venida Jarvis. Nadara Mustard [place] Patient [facility] for Infectious Disease Phone: [phone] Fax:  [phone]

## 2018-08-20 NOTE — Telephone Encounter (Signed)
Patient made aware to continue Symfilo. Patient assistance advocate Inez Catalina at [facility] also made aware for any possible copay assistance.  Eugenia Mcalpine, LPN

## 2018-10-17 ENCOUNTER — Other Ambulatory Visit: Payer: Self-pay | Admitting: *Deleted

## 2018-10-17 DIAGNOSIS — B20 Human immunodeficiency virus [HIV] disease: Secondary | ICD-10-CM

## 2018-10-17 DIAGNOSIS — Z79899 Other long term (current) drug therapy: Secondary | ICD-10-CM

## 2018-10-17 DIAGNOSIS — Z113 Encounter for screening for infections with a predominantly sexual mode of transmission: Secondary | ICD-10-CM

## 2018-10-22 ENCOUNTER — Other Ambulatory Visit (HOSPITAL_COMMUNITY)
Admission: RE | Admit: 2018-10-22 | Discharge: 2018-10-22 | Disposition: A | Discharge status: Home / Self Care | Payer: Medicare Other | Source: Ambulatory Visit | Attending: Infectious Diseases | Admitting: Infectious Diseases

## 2018-10-22 ENCOUNTER — Other Ambulatory Visit: Payer: Self-pay

## 2018-10-22 ENCOUNTER — Other Ambulatory Visit: Payer: Medicare Other

## 2018-10-22 DIAGNOSIS — Z113 Encounter for screening for infections with a predominantly sexual mode of transmission: Secondary | ICD-10-CM | POA: Diagnosis not present

## 2018-10-22 DIAGNOSIS — B20 Human immunodeficiency virus [HIV] disease: Secondary | ICD-10-CM | POA: Diagnosis not present

## 2018-10-22 DIAGNOSIS — Z79899 Other long term (current) drug therapy: Secondary | ICD-10-CM

## 2018-10-23 LAB — T-HELPER CELL (CD4) - (RCID CLINIC ONLY)
CD4 % Helper T Cell: 38 % (ref 33–65)
CD4 T Cell Abs: 750 /uL (ref 400–1790)

## 2018-10-23 LAB — URINE CYTOLOGY ANCILLARY ONLY
Chlamydia: NEGATIVE
Neisseria Gonorrhea: NEGATIVE

## 2018-10-28 ENCOUNTER — Telehealth: Payer: Self-pay | Admitting: Pharmacy Technician

## 2018-10-28 NOTE — Telephone Encounter (Signed)
[facility] Patient Advocate Encounter  Patient now has active BC[employer] Medicare coverage and would like to utilize their mail order pharmacy to get 90-day refills. We applied for Patient Riley [place] patient assistance and received notification that they need further information on income documentation (federal tax returm or SSN documentation). Patient has an appointment on 07/21 with Dr. Johnnye Sima and will bring the forms with him to the appointment or send them the week prior. He currently has 30 days of medication remaining.

## 2018-10-30 LAB — COMPLETE METABOLIC PANEL WITH GFR
AG Ratio: 1.9 (calc) (ref 1.0–2.5)
ALT: 70 U/L — ABNORMAL HIGH (ref 9–46)
AST: 82 U/L — ABNORMAL HIGH (ref 10–35)
Albumin: 3.8 g/dL (ref 3.6–5.1)
Alkaline phosphatase (APISO): 49 U/L (ref 35–144)
BUN: 13 mg/dL (ref 7–25)
CO2: 27 mmol/L (ref 20–32)
Calcium: 9.4 mg/dL (ref 8.6–10.3)
Chloride: 107 mmol/L (ref 98–110)
Creat: 1.16 mg/dL (ref 0.70–1.25)
GFR, Est African American: 77 mL/min/{1.73_m2} (ref >=60)
GFR, Est Non African American: 66 mL/min/{1.73_m2} (ref >=60)
Globulin: 2 g/dL (calc) (ref 1.9–3.7)
Glucose, Bld: 110 mg/dL — ABNORMAL HIGH (ref 65–99)
Potassium: 5.2 mmol/L (ref 3.5–5.3)
Sodium: 142 mmol/L (ref 135–146)
Total Bilirubin: 0.5 mg/dL (ref 0.2–1.2)
Total Protein: 5.8 g/dL — ABNORMAL LOW (ref 6.1–8.1)

## 2018-10-30 LAB — CBC WITH DIFFERENTIAL/PLATELET
Absolute Monocytes: 569 cells/uL (ref 200–950)
Basophils Absolute: 29 cells/uL (ref 0–200)
Basophils Relative: 0.4 %
Eosinophils Absolute: 58 cells/uL (ref 15–500)
Eosinophils Relative: 0.8 %
HCT: 46 % (ref 38.5–50.0)
Hemoglobin: 16 g/dL (ref 13.2–17.1)
Lymphs Abs: 2174 cells/uL (ref 850–3900)
MCH: 36.2 pg — ABNORMAL HIGH (ref 27.0–33.0)
MCHC: 34.8 g/dL (ref 32.0–36.0)
MCV: 104.1 fL — ABNORMAL HIGH (ref 80.0–100.0)
MPV: 8.7 fL (ref 7.5–12.5)
Monocytes Relative: 7.9 %
Neutro Abs: 4370 cells/uL (ref 1500–7800)
Neutrophils Relative %: 60.7 %
Platelets: 340 10*3/uL (ref 140–400)
RBC: 4.42 10*6/uL (ref 4.20–5.80)
RDW: 12.4 % (ref 11.0–15.0)
Total Lymphocyte: 30.2 %
WBC: 7.2 10*3/uL (ref 3.8–10.8)

## 2018-10-30 LAB — HIV-1 RNA QUANT-NO REFLEX-BLD
HIV 1 RNA Quant: <20 copies/mL
HIV-1 RNA Quant, Log: <1.3 Log copies/mL

## 2018-10-30 LAB — LIPID PANEL
Cholesterol: 199 mg/dL (ref <200)
HDL: 60 mg/dL (ref >=40)
LDL Cholesterol (Calc): 119 mg/dL (calc) — ABNORMAL HIGH
Non-HDL Cholesterol (Calc): 139 mg/dL (calc) — ABNORMAL HIGH (ref <130)
Total CHOL/HDL Ratio: 3.3 (calc) (ref <5.0)
Triglycerides: 98 mg/dL (ref <150)

## 2018-10-30 LAB — RPR: RPR Ser Ql: NONREACTIVE

## 2018-11-04 NOTE — Telephone Encounter (Signed)
Patient sent an e-mail containing documents needed for the application. Both documents have been upload to the Patient [place] for processing. Will check status before patient's appointment.

## 2018-11-05 DIAGNOSIS — D235 Other benign neoplasm of skin of trunk: Secondary | ICD-10-CM | POA: Diagnosis not present

## 2018-11-05 DIAGNOSIS — Z85828 Personal history of other malignant neoplasm of skin: Secondary | ICD-10-CM | POA: Diagnosis not present

## 2018-11-05 DIAGNOSIS — I10 Essential (primary) hypertension: Secondary | ICD-10-CM | POA: Diagnosis not present

## 2018-11-05 DIAGNOSIS — E785 Hyperlipidemia, unspecified: Secondary | ICD-10-CM | POA: Diagnosis not present

## 2018-11-05 DIAGNOSIS — I2510 Atherosclerotic heart disease of native coronary artery without angina pectoris: Secondary | ICD-10-CM | POA: Diagnosis not present

## 2018-11-05 DIAGNOSIS — L821 Other seborrheic keratosis: Secondary | ICD-10-CM | POA: Diagnosis not present

## 2018-11-05 DIAGNOSIS — D485 Neoplasm of uncertain behavior of skin: Secondary | ICD-10-CM | POA: Diagnosis not present

## 2018-11-05 DIAGNOSIS — L570 Actinic keratosis: Secondary | ICD-10-CM | POA: Diagnosis not present

## 2018-11-11 ENCOUNTER — Ambulatory Visit (INDEPENDENT_AMBULATORY_CARE_PROVIDER_SITE_OTHER): Payer: Medicare Other | Admitting: Infectious Diseases

## 2018-11-11 ENCOUNTER — Other Ambulatory Visit: Payer: Self-pay

## 2018-11-11 ENCOUNTER — Encounter: Payer: Self-pay | Admitting: Infectious Diseases

## 2018-11-11 VITALS — BP 134/83 | HR 69 | Temp 98.8°F

## 2018-11-11 DIAGNOSIS — I890 Lymphedema, not elsewhere classified: Secondary | ICD-10-CM | POA: Diagnosis not present

## 2018-11-11 DIAGNOSIS — Z113 Encounter for screening for infections with a predominantly sexual mode of transmission: Secondary | ICD-10-CM | POA: Diagnosis not present

## 2018-11-11 DIAGNOSIS — E785 Hyperlipidemia, unspecified: Secondary | ICD-10-CM

## 2018-11-11 DIAGNOSIS — B20 Human immunodeficiency virus [HIV] disease: Secondary | ICD-10-CM

## 2018-11-11 DIAGNOSIS — E669 Obesity, unspecified: Secondary | ICD-10-CM

## 2018-11-11 DIAGNOSIS — Z79899 Other long term (current) drug therapy: Secondary | ICD-10-CM

## 2018-11-11 MED ORDER — SYMFI LO 400-300-300 MG PO TABS: 1.0000 | ORAL_TABLET | Freq: Every day | ORAL | 4 refills | Status: DC

## 2018-11-11 NOTE — Assessment & Plan Note (Addendum)
Wt unchanged.  Continue fish oil Lipids done non-fasting.   Lab Results  Component Value Date   CHOL 199 10/22/2018   HDL 60 10/22/2018   LDLCALC 119 (H) 10/22/2018   TRIG 98 10/22/2018   CHOLHDL 3.3 10/22/2018

## 2018-11-11 NOTE — Assessment & Plan Note (Signed)
Encouraged to watch diet, exercise.  

## 2018-11-11 NOTE — Progress Notes (Signed)
   Subjective:    Patient ID: Riley Cortez, male    DOB: 08-30-1955, 63 y.o.   MRN: [id number]  HPI 63yo M with hx of HIV+, hyperlipidemia, and HTN. Is on atripla, lipitor.  Prev TIA Sept 2013. Prev basal cell CA of R leg exceised.  Now retired, has been doing well. "I feel great".  Has been going to mtns quite a bit.  Had to give up 2 trips this summer due to [place].  No sick exposures, stays home. Wants to get COVID tested. Lives with ex.  Wt steady.   Review of Systems  Constitutional: Negative for appetite change, chills, fever and unexpected weight change.  Respiratory: Negative for cough and shortness of breath.   Cardiovascular: Positive for leg swelling.  Gastrointestinal: Negative for constipation and diarrhea.  Genitourinary: Negative for difficulty urinating.  Please see HPI. All other systems reviewed and negative.      Objective:   Physical Exam Constitutional:      Appearance: Normal appearance.  HENT:     Mouth/Throat:     Mouth: Mucous membranes are moist.     Pharynx: No oropharyngeal exudate.  Eyes:     Extraocular Movements: Extraocular movements intact.     Pupils: Pupils are equal, round, and reactive to light.  Neck:     Musculoskeletal: Normal range of motion and neck supple.  Cardiovascular:     Rate and Rhythm: Normal rate and regular rhythm.  Pulmonary:     Effort: Pulmonary effort is normal.     Breath sounds: Normal breath sounds.  Abdominal:     General: Bowel sounds are normal. There is no distension.     Palpations: Abdomen is soft.     Tenderness: There is no abdominal tenderness.  Musculoskeletal:     Right lower leg: Edema present.     Left lower leg: No edema.  Neurological:     Mental Status: He is alert.        Assessment & Plan:

## 2018-11-11 NOTE — Assessment & Plan Note (Signed)
Intermittent.  Will follow with PCP.  Has refused to wear compression socks.

## 2018-11-11 NOTE — Addendum Note (Signed)
Addended by: Kamauri Denardo C on: 11/11/2018 02:49 PM   Modules accepted: Orders

## 2018-11-11 NOTE — Assessment & Plan Note (Addendum)
He is doing well.  vax are up to date.  Will see him back in 1 year.  Labs prior.  No change in meds- symfilo, got scholarship for his rx.  Will get shingrix at drug store.     HIV 1 RNA Quant (copies/mL)  Date Value  10/22/2018 <20 NOT DETECTED  09/30/2017 24 (H)  01/23/2017 <20 NOT DETECTED   CD4 T Cell Abs (/uL)  Date Value  10/22/2018 750  09/30/2017 560  01/23/2017 730

## 2018-11-18 ENCOUNTER — Other Ambulatory Visit: Payer: Self-pay | Admitting: Infectious Diseases

## 2018-11-18 ENCOUNTER — Other Ambulatory Visit: Payer: Self-pay | Admitting: *Deleted

## 2018-11-18 DIAGNOSIS — I252 Old myocardial infarction: Secondary | ICD-10-CM | POA: Diagnosis not present

## 2018-11-18 DIAGNOSIS — B20 Human immunodeficiency virus [HIV] disease: Secondary | ICD-10-CM

## 2018-11-18 DIAGNOSIS — I2510 Atherosclerotic heart disease of native coronary artery without angina pectoris: Secondary | ICD-10-CM | POA: Diagnosis not present

## 2018-11-18 DIAGNOSIS — I10 Essential (primary) hypertension: Secondary | ICD-10-CM | POA: Diagnosis not present

## 2018-11-18 DIAGNOSIS — E785 Hyperlipidemia, unspecified: Secondary | ICD-10-CM | POA: Diagnosis not present

## 2018-11-18 MED ORDER — SYMFI 600-300-300 MG PO TABS: 1.0000 | ORAL_TABLET | Freq: Every day | ORAL | 4 refills | Status: DC

## 2018-11-27 LAB — SARS-COV-2 RNA,(COVID-19) QUALITATIVE NAAT: SARS CoV2 RNA: NOT DETECTED

## 2018-12-22 DIAGNOSIS — Z0120 Encounter for dental examination and cleaning without abnormal findings: Secondary | ICD-10-CM | POA: Diagnosis not present

## 2019-01-03 DIAGNOSIS — K8020 Calculus of gallbladder without cholecystitis without obstruction: Secondary | ICD-10-CM | POA: Diagnosis not present

## 2019-01-03 DIAGNOSIS — N281 Cyst of kidney, acquired: Secondary | ICD-10-CM | POA: Diagnosis not present

## 2019-01-03 DIAGNOSIS — N201 Calculus of ureter: Secondary | ICD-10-CM | POA: Diagnosis not present

## 2019-01-03 DIAGNOSIS — N132 Hydronephrosis with renal and ureteral calculous obstruction: Secondary | ICD-10-CM | POA: Diagnosis not present

## 2019-01-03 DIAGNOSIS — R109 Unspecified abdominal pain: Secondary | ICD-10-CM | POA: Diagnosis not present

## 2019-01-05 DIAGNOSIS — N201 Calculus of ureter: Secondary | ICD-10-CM | POA: Diagnosis not present

## 2019-01-06 DIAGNOSIS — Z7982 Long term (current) use of aspirin: Secondary | ICD-10-CM | POA: Diagnosis not present

## 2019-01-06 DIAGNOSIS — Z21 Asymptomatic human immunodeficiency virus [HIV] infection status: Secondary | ICD-10-CM | POA: Diagnosis not present

## 2019-01-06 DIAGNOSIS — N201 Calculus of ureter: Secondary | ICD-10-CM | POA: Diagnosis not present

## 2019-01-06 DIAGNOSIS — I252 Old myocardial infarction: Secondary | ICD-10-CM | POA: Diagnosis not present

## 2019-01-06 DIAGNOSIS — Z8673 Personal history of transient ischemic attack (TIA), and cerebral infarction without residual deficits: Secondary | ICD-10-CM | POA: Diagnosis not present

## 2019-01-06 DIAGNOSIS — N202 Calculus of kidney with calculus of ureter: Secondary | ICD-10-CM | POA: Diagnosis not present

## 2019-01-06 DIAGNOSIS — Z79899 Other long term (current) drug therapy: Secondary | ICD-10-CM | POA: Diagnosis not present

## 2019-01-06 DIAGNOSIS — I2510 Atherosclerotic heart disease of native coronary artery without angina pectoris: Secondary | ICD-10-CM | POA: Diagnosis not present

## 2019-01-06 DIAGNOSIS — B20 Human immunodeficiency virus [HIV] disease: Secondary | ICD-10-CM | POA: Diagnosis not present

## 2019-01-13 DIAGNOSIS — Z21 Asymptomatic human immunodeficiency virus [HIV] infection status: Secondary | ICD-10-CM | POA: Diagnosis not present

## 2019-01-13 DIAGNOSIS — E669 Obesity, unspecified: Secondary | ICD-10-CM | POA: Diagnosis not present

## 2019-01-13 DIAGNOSIS — N200 Calculus of kidney: Secondary | ICD-10-CM | POA: Diagnosis not present

## 2019-01-13 DIAGNOSIS — Z6837 Body mass index (BMI) 37.0-37.9, adult: Secondary | ICD-10-CM | POA: Diagnosis not present

## 2019-01-19 DIAGNOSIS — N202 Calculus of kidney with calculus of ureter: Secondary | ICD-10-CM | POA: Diagnosis not present

## 2019-01-19 DIAGNOSIS — N200 Calculus of kidney: Secondary | ICD-10-CM | POA: Diagnosis not present

## 2019-01-19 DIAGNOSIS — N2889 Other specified disorders of kidney and ureter: Secondary | ICD-10-CM | POA: Diagnosis not present

## 2019-01-19 DIAGNOSIS — Z466 Encounter for fitting and adjustment of urinary device: Secondary | ICD-10-CM | POA: Diagnosis not present

## 2019-01-19 DIAGNOSIS — I862 Pelvic varices: Secondary | ICD-10-CM | POA: Diagnosis not present

## 2019-01-20 DIAGNOSIS — Z20828 Contact with and (suspected) exposure to other viral communicable diseases: Secondary | ICD-10-CM | POA: Diagnosis not present

## 2019-01-22 DIAGNOSIS — I252 Old myocardial infarction: Secondary | ICD-10-CM | POA: Diagnosis not present

## 2019-01-22 DIAGNOSIS — Z466 Encounter for fitting and adjustment of urinary device: Secondary | ICD-10-CM | POA: Diagnosis not present

## 2019-01-22 DIAGNOSIS — I10 Essential (primary) hypertension: Secondary | ICD-10-CM | POA: Diagnosis not present

## 2019-01-22 DIAGNOSIS — B20 Human immunodeficiency virus [HIV] disease: Secondary | ICD-10-CM | POA: Diagnosis not present

## 2019-01-22 DIAGNOSIS — N200 Calculus of kidney: Secondary | ICD-10-CM | POA: Diagnosis not present

## 2019-02-03 DIAGNOSIS — N200 Calculus of kidney: Secondary | ICD-10-CM | POA: Diagnosis not present

## 2019-02-04 DIAGNOSIS — H4720 Unspecified optic atrophy: Secondary | ICD-10-CM | POA: Diagnosis not present

## 2019-02-04 DIAGNOSIS — H5213 Myopia, bilateral: Secondary | ICD-10-CM | POA: Diagnosis not present

## 2019-02-12 DIAGNOSIS — Z0000 Encounter for general adult medical examination without abnormal findings: Secondary | ICD-10-CM | POA: Diagnosis not present

## 2019-02-12 DIAGNOSIS — Z23 Encounter for immunization: Secondary | ICD-10-CM | POA: Diagnosis not present

## 2019-02-12 DIAGNOSIS — B20 Human immunodeficiency virus [HIV] disease: Secondary | ICD-10-CM | POA: Diagnosis not present

## 2019-02-12 DIAGNOSIS — I890 Lymphedema, not elsewhere classified: Secondary | ICD-10-CM | POA: Diagnosis not present

## 2019-03-12 DIAGNOSIS — M25551 Pain in right hip: Secondary | ICD-10-CM | POA: Diagnosis not present

## 2019-03-12 DIAGNOSIS — R269 Unspecified abnormalities of gait and mobility: Secondary | ICD-10-CM | POA: Diagnosis not present

## 2019-03-24 DIAGNOSIS — R269 Unspecified abnormalities of gait and mobility: Secondary | ICD-10-CM | POA: Diagnosis not present

## 2019-03-24 DIAGNOSIS — M25551 Pain in right hip: Secondary | ICD-10-CM | POA: Diagnosis not present

## 2019-03-25 DIAGNOSIS — G459 Transient cerebral ischemic attack, unspecified: Secondary | ICD-10-CM | POA: Diagnosis not present

## 2019-03-25 DIAGNOSIS — I2510 Atherosclerotic heart disease of native coronary artery without angina pectoris: Secondary | ICD-10-CM | POA: Diagnosis not present

## 2019-03-25 DIAGNOSIS — I10 Essential (primary) hypertension: Secondary | ICD-10-CM | POA: Diagnosis not present

## 2019-03-25 DIAGNOSIS — E785 Hyperlipidemia, unspecified: Secondary | ICD-10-CM | POA: Diagnosis not present

## 2019-03-26 DIAGNOSIS — R269 Unspecified abnormalities of gait and mobility: Secondary | ICD-10-CM | POA: Diagnosis not present

## 2019-03-26 DIAGNOSIS — M25551 Pain in right hip: Secondary | ICD-10-CM | POA: Diagnosis not present

## 2019-04-01 DIAGNOSIS — R269 Unspecified abnormalities of gait and mobility: Secondary | ICD-10-CM | POA: Diagnosis not present

## 2019-04-01 DIAGNOSIS — M25551 Pain in right hip: Secondary | ICD-10-CM | POA: Diagnosis not present

## 2019-05-07 DIAGNOSIS — M25551 Pain in right hip: Secondary | ICD-10-CM | POA: Diagnosis not present

## 2019-05-07 DIAGNOSIS — R269 Unspecified abnormalities of gait and mobility: Secondary | ICD-10-CM | POA: Diagnosis not present

## 2019-05-14 DIAGNOSIS — M25551 Pain in right hip: Secondary | ICD-10-CM | POA: Diagnosis not present

## 2019-05-14 DIAGNOSIS — R269 Unspecified abnormalities of gait and mobility: Secondary | ICD-10-CM | POA: Diagnosis not present

## 2019-06-03 DIAGNOSIS — M25551 Pain in right hip: Secondary | ICD-10-CM | POA: Diagnosis not present

## 2019-06-03 DIAGNOSIS — R269 Unspecified abnormalities of gait and mobility: Secondary | ICD-10-CM | POA: Diagnosis not present

## 2019-06-16 DIAGNOSIS — Z0120 Encounter for dental examination and cleaning without abnormal findings: Secondary | ICD-10-CM | POA: Diagnosis not present

## 2019-06-24 DIAGNOSIS — M25551 Pain in right hip: Secondary | ICD-10-CM | POA: Diagnosis not present

## 2019-06-24 DIAGNOSIS — R269 Unspecified abnormalities of gait and mobility: Secondary | ICD-10-CM | POA: Diagnosis not present

## 2019-07-22 DIAGNOSIS — M25551 Pain in right hip: Secondary | ICD-10-CM | POA: Diagnosis not present

## 2019-07-22 DIAGNOSIS — R269 Unspecified abnormalities of gait and mobility: Secondary | ICD-10-CM | POA: Diagnosis not present

## 2019-08-04 DIAGNOSIS — I872 Venous insufficiency (chronic) (peripheral): Secondary | ICD-10-CM | POA: Diagnosis not present

## 2019-08-04 DIAGNOSIS — I10 Essential (primary) hypertension: Secondary | ICD-10-CM | POA: Diagnosis not present

## 2019-08-04 DIAGNOSIS — E785 Hyperlipidemia, unspecified: Secondary | ICD-10-CM | POA: Diagnosis not present

## 2019-08-04 DIAGNOSIS — I2510 Atherosclerotic heart disease of native coronary artery without angina pectoris: Secondary | ICD-10-CM | POA: Diagnosis not present

## 2019-08-12 DIAGNOSIS — R269 Unspecified abnormalities of gait and mobility: Secondary | ICD-10-CM | POA: Diagnosis not present

## 2019-08-12 DIAGNOSIS — M25551 Pain in right hip: Secondary | ICD-10-CM | POA: Diagnosis not present

## 2019-09-02 DIAGNOSIS — R269 Unspecified abnormalities of gait and mobility: Secondary | ICD-10-CM | POA: Diagnosis not present

## 2019-09-02 DIAGNOSIS — M25551 Pain in right hip: Secondary | ICD-10-CM | POA: Diagnosis not present

## 2019-09-07 DIAGNOSIS — N200 Calculus of kidney: Secondary | ICD-10-CM | POA: Diagnosis not present

## 2019-09-14 DIAGNOSIS — Z87442 Personal history of urinary calculi: Secondary | ICD-10-CM | POA: Diagnosis not present

## 2019-09-23 DIAGNOSIS — R269 Unspecified abnormalities of gait and mobility: Secondary | ICD-10-CM | POA: Diagnosis not present

## 2019-09-23 DIAGNOSIS — M25551 Pain in right hip: Secondary | ICD-10-CM | POA: Diagnosis not present

## 2019-09-28 ENCOUNTER — Telehealth: Payer: Self-pay | Admitting: Pharmacy Technician

## 2019-09-28 NOTE — Telephone Encounter (Signed)
[facility] Patient Advocate Encounter   I was successful in securing patient a grant from Patient [place] (PAF) to provide copayment coverage for Symfi. This will make the out of pocket cost $0. He fills with [facility]. Balance is currently $2713.34   The billing information is   Dates of Eligibility: 11/07/2018 through 11/07/2019 Renew on 11/08/2019, patient will call as a reminder as well.   His new information HHS 1, income [phone] and insurance remained the same.

## 2019-10-14 DIAGNOSIS — M25551 Pain in right hip: Secondary | ICD-10-CM | POA: Diagnosis not present

## 2019-10-14 DIAGNOSIS — R269 Unspecified abnormalities of gait and mobility: Secondary | ICD-10-CM | POA: Diagnosis not present

## 2019-11-04 DIAGNOSIS — M25551 Pain in right hip: Secondary | ICD-10-CM | POA: Diagnosis not present

## 2019-11-04 DIAGNOSIS — R269 Unspecified abnormalities of gait and mobility: Secondary | ICD-10-CM | POA: Diagnosis not present

## 2019-11-09 ENCOUNTER — Telehealth: Payer: Self-pay | Admitting: Pharmacy Technician

## 2019-11-09 NOTE — Telephone Encounter (Signed)
[facility] Patient Advocate Encounter   I was successful in securing patient a $7500 grant from Patient [place] (PAF) to provide copayment coverage for Simfi.  This will make the out of pocket cost $0.     I have spoken with the patient.    The billing information is as follows and has been shared with [place].    Patient knows to call the office with questions or concerns.  Venida Jarvis. Nadara Mustard [place] Patient [facility] for Infectious Disease Phone: [phone] Fax:  [phone]

## 2019-11-19 ENCOUNTER — Other Ambulatory Visit: Payer: Self-pay

## 2019-11-19 DIAGNOSIS — B20 Human immunodeficiency virus [HIV] disease: Secondary | ICD-10-CM

## 2019-11-19 MED ORDER — EFAVIRENZ-LAMIVUDINE-TENOFOVIR 600-300-300 MG PO TABS: 1.0000 | ORAL_TABLET | Freq: Every day | ORAL | 0 refills | Status: DC

## 2019-11-23 DIAGNOSIS — Z85828 Personal history of other malignant neoplasm of skin: Secondary | ICD-10-CM | POA: Diagnosis not present

## 2019-11-23 DIAGNOSIS — L821 Other seborrheic keratosis: Secondary | ICD-10-CM | POA: Diagnosis not present

## 2019-11-23 DIAGNOSIS — L814 Other melanin hyperpigmentation: Secondary | ICD-10-CM | POA: Diagnosis not present

## 2019-11-23 DIAGNOSIS — D225 Melanocytic nevi of trunk: Secondary | ICD-10-CM | POA: Diagnosis not present

## 2019-12-02 DIAGNOSIS — R269 Unspecified abnormalities of gait and mobility: Secondary | ICD-10-CM | POA: Diagnosis not present

## 2019-12-02 DIAGNOSIS — M25551 Pain in right hip: Secondary | ICD-10-CM | POA: Diagnosis not present

## 2019-12-04 ENCOUNTER — Other Ambulatory Visit: Payer: Self-pay

## 2019-12-04 DIAGNOSIS — B20 Human immunodeficiency virus [HIV] disease: Secondary | ICD-10-CM

## 2019-12-04 DIAGNOSIS — Z79899 Other long term (current) drug therapy: Secondary | ICD-10-CM

## 2019-12-04 DIAGNOSIS — Z113 Encounter for screening for infections with a predominantly sexual mode of transmission: Secondary | ICD-10-CM

## 2019-12-09 ENCOUNTER — Other Ambulatory Visit: Payer: Self-pay

## 2019-12-09 ENCOUNTER — Other Ambulatory Visit: Payer: Medicare Other

## 2019-12-09 ENCOUNTER — Other Ambulatory Visit (HOSPITAL_COMMUNITY)
Admission: RE | Admit: 2019-12-09 | Discharge: 2019-12-09 | Disposition: A | Discharge status: Home / Self Care | Payer: Medicare Other | Source: Ambulatory Visit | Attending: Infectious Diseases | Admitting: Infectious Diseases

## 2019-12-09 DIAGNOSIS — B20 Human immunodeficiency virus [HIV] disease: Secondary | ICD-10-CM

## 2019-12-09 DIAGNOSIS — Z113 Encounter for screening for infections with a predominantly sexual mode of transmission: Secondary | ICD-10-CM

## 2019-12-09 DIAGNOSIS — R7303 Prediabetes: Secondary | ICD-10-CM | POA: Diagnosis not present

## 2019-12-09 DIAGNOSIS — Z79899 Other long term (current) drug therapy: Secondary | ICD-10-CM | POA: Diagnosis not present

## 2019-12-09 DIAGNOSIS — I10 Essential (primary) hypertension: Secondary | ICD-10-CM | POA: Diagnosis not present

## 2019-12-09 DIAGNOSIS — E785 Hyperlipidemia, unspecified: Secondary | ICD-10-CM | POA: Diagnosis not present

## 2019-12-10 LAB — T-HELPER CELL (CD4) - (RCID CLINIC ONLY)
CD4 % Helper T Cell: 37 % (ref 33–65)
CD4 T Cell Abs: 707 /uL (ref 400–1790)

## 2019-12-10 LAB — URINE CYTOLOGY ANCILLARY ONLY
Chlamydia: NEGATIVE
Comment: NEGATIVE
Comment: NORMAL
Neisseria Gonorrhea: NEGATIVE

## 2019-12-11 ENCOUNTER — Other Ambulatory Visit: Payer: Self-pay | Admitting: Infectious Diseases

## 2019-12-11 DIAGNOSIS — B20 Human immunodeficiency virus [HIV] disease: Secondary | ICD-10-CM

## 2019-12-12 LAB — COMPLETE METABOLIC PANEL WITH GFR
AG Ratio: 2 (calc) (ref 1.0–2.5)
ALT: 50 U/L — ABNORMAL HIGH (ref 9–46)
AST: 46 U/L — ABNORMAL HIGH (ref 10–35)
Albumin: 3.6 g/dL (ref 3.6–5.1)
Alkaline phosphatase (APISO): 43 U/L (ref 35–144)
BUN: 15 mg/dL (ref 7–25)
CO2: 27 mmol/L (ref 20–32)
Calcium: 8.6 mg/dL (ref 8.6–10.3)
Chloride: 107 mmol/L (ref 98–110)
Creat: 1.05 mg/dL (ref 0.70–1.25)
GFR, Est African American: 85 mL/min/{1.73_m2} (ref >=60)
GFR, Est Non African American: 74 mL/min/{1.73_m2} (ref >=60)
Globulin: 1.8 g/dL (calc) — ABNORMAL LOW (ref 1.9–3.7)
Glucose, Bld: 90 mg/dL (ref 65–99)
Potassium: 4.7 mmol/L (ref 3.5–5.3)
Sodium: 141 mmol/L (ref 135–146)
Total Bilirubin: 0.3 mg/dL (ref 0.2–1.2)
Total Protein: 5.4 g/dL — ABNORMAL LOW (ref 6.1–8.1)

## 2019-12-12 LAB — CBC WITH DIFFERENTIAL/PLATELET
Absolute Monocytes: 927 cells/uL (ref 200–950)
Basophils Absolute: 30 cells/uL (ref 0–200)
Basophils Relative: 0.4 %
Eosinophils Absolute: 38 cells/uL (ref 15–500)
Eosinophils Relative: 0.5 %
HCT: 43.1 % (ref 38.5–50.0)
Hemoglobin: 15.2 g/dL (ref 13.2–17.1)
Lymphs Abs: 1953 cells/uL (ref 850–3900)
MCH: 37.2 pg — ABNORMAL HIGH (ref 27.0–33.0)
MCHC: 35.3 g/dL (ref 32.0–36.0)
MCV: 105.4 fL — ABNORMAL HIGH (ref 80.0–100.0)
MPV: 8.6 fL (ref 7.5–12.5)
Monocytes Relative: 12.2 %
Neutro Abs: 4651 cells/uL (ref 1500–7800)
Neutrophils Relative %: 61.2 %
Platelets: 298 10*3/uL (ref 140–400)
RBC: 4.09 10*6/uL — ABNORMAL LOW (ref 4.20–5.80)
RDW: 11.9 % (ref 11.0–15.0)
Total Lymphocyte: 25.7 %
WBC: 7.6 10*3/uL (ref 3.8–10.8)

## 2019-12-12 LAB — RPR: RPR Ser Ql: NONREACTIVE

## 2019-12-12 LAB — LIPID PANEL
Cholesterol: 159 mg/dL (ref <200)
HDL: 47 mg/dL (ref >=40)
LDL Cholesterol (Calc): 92 mg/dL (calc)
Non-HDL Cholesterol (Calc): 112 mg/dL (calc) (ref <130)
Total CHOL/HDL Ratio: 3.4 (calc) (ref <5.0)
Triglycerides: 107 mg/dL (ref <150)

## 2019-12-12 LAB — HIV-1 RNA QUANT-NO REFLEX-BLD
HIV 1 RNA Quant: 25 Copies/mL — ABNORMAL HIGH
HIV-1 RNA Quant, Log: 1.39 Log cps/mL — ABNORMAL HIGH

## 2019-12-14 DIAGNOSIS — I2510 Atherosclerotic heart disease of native coronary artery without angina pectoris: Secondary | ICD-10-CM | POA: Diagnosis not present

## 2019-12-14 DIAGNOSIS — I10 Essential (primary) hypertension: Secondary | ICD-10-CM | POA: Diagnosis not present

## 2019-12-14 DIAGNOSIS — E785 Hyperlipidemia, unspecified: Secondary | ICD-10-CM | POA: Diagnosis not present

## 2019-12-14 DIAGNOSIS — I872 Venous insufficiency (chronic) (peripheral): Secondary | ICD-10-CM | POA: Diagnosis not present

## 2019-12-15 DIAGNOSIS — Z0120 Encounter for dental examination and cleaning without abnormal findings: Secondary | ICD-10-CM | POA: Diagnosis not present

## 2019-12-22 ENCOUNTER — Other Ambulatory Visit: Payer: Self-pay

## 2019-12-22 ENCOUNTER — Ambulatory Visit: Payer: Self-pay

## 2019-12-22 ENCOUNTER — Encounter: Payer: Self-pay | Admitting: Infectious Diseases

## 2019-12-22 ENCOUNTER — Ambulatory Visit: Payer: Medicare Other | Admitting: Infectious Diseases

## 2019-12-22 VITALS — BP 127/80 | HR 70 | Temp 99.3°F | Wt 250.0 lb

## 2019-12-22 DIAGNOSIS — Z113 Encounter for screening for infections with a predominantly sexual mode of transmission: Secondary | ICD-10-CM | POA: Diagnosis not present

## 2019-12-22 DIAGNOSIS — I890 Lymphedema, not elsewhere classified: Secondary | ICD-10-CM | POA: Diagnosis not present

## 2019-12-22 DIAGNOSIS — Z79899 Other long term (current) drug therapy: Secondary | ICD-10-CM

## 2019-12-22 DIAGNOSIS — B20 Human immunodeficiency virus [HIV] disease: Secondary | ICD-10-CM

## 2019-12-22 DIAGNOSIS — N200 Calculus of kidney: Secondary | ICD-10-CM | POA: Diagnosis not present

## 2019-12-22 DIAGNOSIS — Z23 Encounter for immunization: Secondary | ICD-10-CM

## 2019-12-22 DIAGNOSIS — E669 Obesity, unspecified: Secondary | ICD-10-CM

## 2019-12-22 MED ORDER — EFAVIRENZ-LAMIVUDINE-TENOFOVIR 600-300-300 MG PO TABS: 1.0000 | ORAL_TABLET | Freq: Every day | ORAL | 3 refills | Status: DC

## 2019-12-22 NOTE — Assessment & Plan Note (Signed)
Will refill his rx (symfi) Will arrange COVID booster.  Offered/refused condoms.  Flu vax when available.  rtc in 1 year.

## 2019-12-22 NOTE — Assessment & Plan Note (Signed)
Appreciate PCP f/u He is wearing compression socks.

## 2019-12-22 NOTE — Progress Notes (Signed)
   Covid-19 Vaccination Clinic  Name:  Riley Cortez    MRN: [id number] DOB: 1955/10/09  12/22/2019  [name] was observed post Covid-19 immunization for 15 minutes without incident. He was provided with Vaccine Information Sheet and instruction to access the V-Safe system.   [name] was instructed to call 911 with any severe reactions post vaccine: Marland Kitchen Difficulty breathing  . Swelling of face and throat  . A fast heartbeat  . A bad rash all over body  . Dizziness and weakness      Carlean Purl, RN

## 2019-12-22 NOTE — Progress Notes (Signed)
   Subjective:    Patient ID: Riley Cortez, male    DOB: [birth year], 64 y.o.   MRN: [id number]  HPI 64yo M with hx of HIV+, hyperlipidemia, and HTN. Is on atripla, lipitor.  Prev TIA Sept 2013. Prev basal cell CA of R legexceised. Feels well, took first air travel last week.Went to [place] for brother, sister-in-laws [employer]. COVID related.   Has been going to mtns quite a bit, tomorrow.   Wants COVID booster today. Last vax was Feb 2021.   HIV 1 RNA Quant  Date Value  12/09/2019 25 Copies/mL (H)  10/22/2018 <20 NOT DETECTED copies/mL  09/30/2017 24 copies/mL (H)   CD4 T Cell Abs (/uL)  Date Value  12/09/2019 707  10/22/2018 750  09/30/2017 560    Review of Systems  Constitutional: Negative for appetite change, chills, fever and unexpected weight change.  Respiratory: Negative for cough and shortness of breath.   Cardiovascular: Positive for leg swelling.  Gastrointestinal: Negative for constipation and diarrhea.  Genitourinary: Negative for difficulty urinating.  Psychiatric/Behavioral: Negative for sleep disturbance.  All other systems reviewed and are negative.      Objective:   Physical Exam Vitals reviewed.  Constitutional:      General: He is not in acute distress.    Appearance: He is not ill-appearing, toxic-appearing or diaphoretic.  HENT:     Mouth/Throat:     Mouth: Mucous membranes are moist.     Pharynx: No oropharyngeal exudate.  Eyes:     Extraocular Movements: Extraocular movements intact.     Pupils: Pupils are equal, round, and reactive to light.  Cardiovascular:     Rate and Rhythm: Normal rate and regular rhythm.  Pulmonary:     Effort: Pulmonary effort is normal.     Breath sounds: Normal breath sounds.  Abdominal:     General: Bowel sounds are normal. There is no distension.     Palpations: Abdomen is soft.     Tenderness: There is no abdominal tenderness.  Musculoskeletal:     Cervical back: Normal range of motion and  neck supple.     Right lower leg: Edema present.     Left lower leg: Edema present.  Neurological:     General: No focal deficit present.     Mental Status: He is alert.           Assessment & Plan:

## 2019-12-22 NOTE — Assessment & Plan Note (Signed)
Has been walking.

## 2019-12-22 NOTE — Assessment & Plan Note (Signed)
Will watch as needed.

## 2019-12-30 DIAGNOSIS — R269 Unspecified abnormalities of gait and mobility: Secondary | ICD-10-CM | POA: Diagnosis not present

## 2019-12-30 DIAGNOSIS — M25551 Pain in right hip: Secondary | ICD-10-CM | POA: Diagnosis not present

## 2020-01-27 DIAGNOSIS — M25551 Pain in right hip: Secondary | ICD-10-CM | POA: Diagnosis not present

## 2020-01-27 DIAGNOSIS — R269 Unspecified abnormalities of gait and mobility: Secondary | ICD-10-CM | POA: Diagnosis not present

## 2020-02-12 ENCOUNTER — Telehealth: Payer: Self-pay

## 2020-02-12 DIAGNOSIS — H4720 Unspecified optic atrophy: Secondary | ICD-10-CM | POA: Diagnosis not present

## 2020-02-12 DIAGNOSIS — H2513 Age-related nuclear cataract, bilateral: Secondary | ICD-10-CM | POA: Diagnosis not present

## 2020-02-12 NOTE — Telephone Encounter (Signed)
[facility] Patient Advocate Encounter  Spoke with the patient today.  He advised me that his Insurance was changing [facility]) and it will not be covering his SYMFI (HIV MED) any longer, however it will pay for ATRIPLA (Generic).  I ran a claim for it and his Copay is $119.49. We can still use his PAF to make his copay $0.00.  He have an Appointment on 12/21 @1 :45 pm.     [name], [place] Patient [facility] for Infectious Disease Phone: [phone] Fax:  [phone]

## 2020-02-15 NOTE — Telephone Encounter (Signed)
Thanks Butch Penny Atripla would be ok Are there other meds it would cover as well? Biktravy?  Thanks jeff

## 2020-02-16 DIAGNOSIS — Z23 Encounter for immunization: Secondary | ICD-10-CM | POA: Diagnosis not present

## 2020-02-16 DIAGNOSIS — Z131 Encounter for screening for diabetes mellitus: Secondary | ICD-10-CM | POA: Diagnosis not present

## 2020-02-16 DIAGNOSIS — Z125 Encounter for screening for malignant neoplasm of prostate: Secondary | ICD-10-CM | POA: Diagnosis not present

## 2020-02-16 DIAGNOSIS — Z0000 Encounter for general adult medical examination without abnormal findings: Secondary | ICD-10-CM | POA: Diagnosis not present

## 2020-02-17 DIAGNOSIS — M25551 Pain in right hip: Secondary | ICD-10-CM | POA: Diagnosis not present

## 2020-02-17 DIAGNOSIS — R269 Unspecified abnormalities of gait and mobility: Secondary | ICD-10-CM | POA: Diagnosis not present

## 2020-03-09 DIAGNOSIS — M25551 Pain in right hip: Secondary | ICD-10-CM | POA: Diagnosis not present

## 2020-03-09 DIAGNOSIS — R269 Unspecified abnormalities of gait and mobility: Secondary | ICD-10-CM | POA: Diagnosis not present

## 2020-04-07 DIAGNOSIS — U071 COVID-19: Secondary | ICD-10-CM | POA: Insufficient documentation

## 2020-04-11 ENCOUNTER — Telehealth: Payer: Self-pay | Admitting: *Deleted

## 2020-04-11 NOTE — Telephone Encounter (Signed)
Thank you :)

## 2020-04-11 NOTE — Telephone Encounter (Signed)
Patient tested positive for Covid on 12/19, called to reschedule his appointment with Dr Johnnye Sima. RN offered virtual appointment/phone call, as the appointment 12/21 is only to discuss medication change since his insurance will no longer cover atripla/symfi starting January. Patient is currently feeling well, says his cough is resolving.  RN gave patient infusion center phone number in case he is interested in antibody infusion due to worsening symptoms. Landis Gandy, RN

## 2020-04-12 ENCOUNTER — Telehealth (INDEPENDENT_AMBULATORY_CARE_PROVIDER_SITE_OTHER): Payer: Medicare Other | Admitting: Infectious Diseases

## 2020-04-12 ENCOUNTER — Other Ambulatory Visit: Payer: Self-pay

## 2020-04-12 ENCOUNTER — Encounter: Payer: Self-pay | Admitting: Infectious Diseases

## 2020-04-12 DIAGNOSIS — B20 Human immunodeficiency virus [HIV] disease: Secondary | ICD-10-CM

## 2020-04-12 DIAGNOSIS — E785 Hyperlipidemia, unspecified: Secondary | ICD-10-CM

## 2020-04-12 DIAGNOSIS — Z79899 Other long term (current) drug therapy: Secondary | ICD-10-CM

## 2020-04-12 DIAGNOSIS — Z113 Encounter for screening for infections with a predominantly sexual mode of transmission: Secondary | ICD-10-CM

## 2020-04-12 DIAGNOSIS — U071 COVID-19: Secondary | ICD-10-CM

## 2020-04-12 MED ORDER — EFAVIRENZ-EMTRICITAB-TENOFOVIR 600-200-300 MG PO TABS: 1.0000 | ORAL_TABLET | Freq: Every day | ORAL | 11 refills | Status: DC

## 2020-04-12 NOTE — Assessment & Plan Note (Signed)
Pt feels better.  Will continue with supportive care at home.  I asked him to call if he feels worse (and will send him for mAb).  He is doing home testing, I cautioned him that he should base his contagiousness on how he feels (not home testing), as his test may remain positive for several months.

## 2020-04-12 NOTE — Progress Notes (Signed)
   Subjective:    Patient ID: Riley Cortez, male    DOB: [birth year], 64 y.o.   MRN: [id number]  HPI 64yo M with hx of HIV+, hyperlipidemia, and HTN. Is on atripla--> symfi, lipitor.  Prev TIA Sept 2013. Prev basal cell CA of R legexceised. Just cam be back from [place].  He developed cough on 12-16 and his COVID test was + on 12-17.  He needs to change his ART from symfi as his insurance will no longer cover. He has 90 days til he runs out.  Was prev on atripla before changed to symfi.   HIV 1 RNA Quant  Date Value  12/09/2019 25 Copies/mL (H)  10/22/2018 <20 NOT DETECTED copies/mL  09/30/2017 24 copies/mL (H)   CD4 T Cell Abs (/uL)  Date Value  12/09/2019 707  10/22/2018 750  09/30/2017 560     Review of Systems  Constitutional: Negative for appetite change, chills, fever and unexpected weight change.  HENT: Positive for rhinorrhea.   Respiratory: Positive for cough. Negative for shortness of breath.   Gastrointestinal: Negative for constipation and diarrhea.  Genitourinary: Negative for difficulty urinating.       Objective:   Physical Exam 1. Due to the national emergency this service was provided using telemedicine. phone.  2. Consent from the patient for the telehealth visit and that you identified patient- yes  3. Your locations, Pt and Provider- home, [facility]  4. Chief complaint for visit- medication change  5. Document anyone else on the call- none  6. If the visit was a phone call, that you include the time you spent on the call.8 minutes          Assessment & Plan:

## 2020-04-12 NOTE — Assessment & Plan Note (Signed)
He is doing well Will change him to [place] to perseve his continuity. At his request. Cautioned about kidney disease, bone loss, wt gain.  Will see him back in 1 year with labs prior.

## 2020-04-12 NOTE — Progress Notes (Signed)
Patient ID: Riley Cortez, male   DOB: 12/04/1955, 64 y.o.   MRN: [id number]

## 2020-04-26 DIAGNOSIS — Z8673 Personal history of transient ischemic attack (TIA), and cerebral infarction without residual deficits: Secondary | ICD-10-CM | POA: Diagnosis not present

## 2020-04-26 DIAGNOSIS — I10 Essential (primary) hypertension: Secondary | ICD-10-CM | POA: Diagnosis not present

## 2020-04-26 DIAGNOSIS — I2510 Atherosclerotic heart disease of native coronary artery without angina pectoris: Secondary | ICD-10-CM | POA: Diagnosis not present

## 2020-04-26 DIAGNOSIS — I872 Venous insufficiency (chronic) (peripheral): Secondary | ICD-10-CM | POA: Diagnosis not present

## 2020-04-27 DIAGNOSIS — R269 Unspecified abnormalities of gait and mobility: Secondary | ICD-10-CM | POA: Diagnosis not present

## 2020-04-27 DIAGNOSIS — M25551 Pain in right hip: Secondary | ICD-10-CM | POA: Diagnosis not present

## 2020-05-12 DIAGNOSIS — M25551 Pain in right hip: Secondary | ICD-10-CM | POA: Diagnosis not present

## 2020-05-12 DIAGNOSIS — R269 Unspecified abnormalities of gait and mobility: Secondary | ICD-10-CM | POA: Diagnosis not present

## 2020-05-25 DIAGNOSIS — H524 Presbyopia: Secondary | ICD-10-CM | POA: Diagnosis not present

## 2020-06-02 DIAGNOSIS — M25551 Pain in right hip: Secondary | ICD-10-CM | POA: Diagnosis not present

## 2020-06-02 DIAGNOSIS — R269 Unspecified abnormalities of gait and mobility: Secondary | ICD-10-CM | POA: Diagnosis not present

## 2020-06-22 DIAGNOSIS — M25551 Pain in right hip: Secondary | ICD-10-CM | POA: Diagnosis not present

## 2020-06-22 DIAGNOSIS — R269 Unspecified abnormalities of gait and mobility: Secondary | ICD-10-CM | POA: Diagnosis not present

## 2020-07-01 ENCOUNTER — Other Ambulatory Visit: Payer: Self-pay

## 2020-07-01 DIAGNOSIS — B20 Human immunodeficiency virus [HIV] disease: Secondary | ICD-10-CM

## 2020-07-01 MED ORDER — EFAVIRENZ-EMTRICITAB-TENOFOVIR 600-200-300 MG PO TABS: 1.0000 | ORAL_TABLET | Freq: Every day | ORAL | 5 refills | Status: DC

## 2020-07-20 DIAGNOSIS — R269 Unspecified abnormalities of gait and mobility: Secondary | ICD-10-CM | POA: Diagnosis not present

## 2020-07-20 DIAGNOSIS — M25551 Pain in right hip: Secondary | ICD-10-CM | POA: Diagnosis not present

## 2020-08-23 DIAGNOSIS — I872 Venous insufficiency (chronic) (peripheral): Secondary | ICD-10-CM | POA: Diagnosis not present

## 2020-08-23 DIAGNOSIS — I2510 Atherosclerotic heart disease of native coronary artery without angina pectoris: Secondary | ICD-10-CM | POA: Diagnosis not present

## 2020-08-23 DIAGNOSIS — B20 Human immunodeficiency virus [HIV] disease: Secondary | ICD-10-CM | POA: Diagnosis not present

## 2020-08-23 DIAGNOSIS — Z8673 Personal history of transient ischemic attack (TIA), and cerebral infarction without residual deficits: Secondary | ICD-10-CM | POA: Diagnosis not present

## 2020-08-30 DIAGNOSIS — R269 Unspecified abnormalities of gait and mobility: Secondary | ICD-10-CM | POA: Diagnosis not present

## 2020-08-30 DIAGNOSIS — M25551 Pain in right hip: Secondary | ICD-10-CM | POA: Diagnosis not present

## 2020-08-30 DIAGNOSIS — M25512 Pain in left shoulder: Secondary | ICD-10-CM | POA: Diagnosis not present

## 2020-09-21 DIAGNOSIS — M25551 Pain in right hip: Secondary | ICD-10-CM | POA: Diagnosis not present

## 2020-09-21 DIAGNOSIS — R269 Unspecified abnormalities of gait and mobility: Secondary | ICD-10-CM | POA: Diagnosis not present

## 2020-09-21 DIAGNOSIS — M25512 Pain in left shoulder: Secondary | ICD-10-CM | POA: Diagnosis not present

## 2020-10-12 DIAGNOSIS — M25512 Pain in left shoulder: Secondary | ICD-10-CM | POA: Diagnosis not present

## 2020-10-12 DIAGNOSIS — M25551 Pain in right hip: Secondary | ICD-10-CM | POA: Diagnosis not present

## 2020-10-12 DIAGNOSIS — R269 Unspecified abnormalities of gait and mobility: Secondary | ICD-10-CM | POA: Diagnosis not present

## 2020-11-02 DIAGNOSIS — M25512 Pain in left shoulder: Secondary | ICD-10-CM | POA: Diagnosis not present

## 2020-11-02 DIAGNOSIS — R269 Unspecified abnormalities of gait and mobility: Secondary | ICD-10-CM | POA: Diagnosis not present

## 2020-11-02 DIAGNOSIS — M25551 Pain in right hip: Secondary | ICD-10-CM | POA: Diagnosis not present

## 2020-11-18 ENCOUNTER — Telehealth: Payer: Self-pay

## 2020-11-18 NOTE — Telephone Encounter (Signed)
[facility] Patient Advocate Encounter   I was successful in securing patient a $7500 grant from Patient [place] (PAF) to provide copayment coverage for Atripla.  This will make the out of pocket cost $0.00.     I have spoken with the patient.        Patient knows to call the office with questions or concerns.  [name], [facility] Patient [facility] for Infectious Disease Phone: [phone] Fax:  [phone]

## 2020-11-22 ENCOUNTER — Other Ambulatory Visit (HOSPITAL_COMMUNITY)
Admission: RE | Admit: 2020-11-22 | Discharge: 2020-11-22 | Disposition: A | Discharge status: Home / Self Care | Payer: Medicare Other | Source: Ambulatory Visit | Attending: Infectious Diseases | Admitting: Infectious Diseases

## 2020-11-22 ENCOUNTER — Other Ambulatory Visit: Payer: Medicare Other

## 2020-11-22 ENCOUNTER — Other Ambulatory Visit: Payer: Self-pay

## 2020-11-22 DIAGNOSIS — E785 Hyperlipidemia, unspecified: Secondary | ICD-10-CM

## 2020-11-22 DIAGNOSIS — B20 Human immunodeficiency virus [HIV] disease: Secondary | ICD-10-CM | POA: Diagnosis not present

## 2020-11-22 DIAGNOSIS — Z113 Encounter for screening for infections with a predominantly sexual mode of transmission: Secondary | ICD-10-CM | POA: Diagnosis not present

## 2020-11-23 DIAGNOSIS — M25551 Pain in right hip: Secondary | ICD-10-CM | POA: Diagnosis not present

## 2020-11-23 DIAGNOSIS — R269 Unspecified abnormalities of gait and mobility: Secondary | ICD-10-CM | POA: Diagnosis not present

## 2020-11-23 DIAGNOSIS — M25512 Pain in left shoulder: Secondary | ICD-10-CM | POA: Diagnosis not present

## 2020-11-23 LAB — URINE CYTOLOGY ANCILLARY ONLY
Chlamydia: NEGATIVE
Comment: NEGATIVE
Comment: NORMAL
Neisseria Gonorrhea: NEGATIVE

## 2020-11-23 LAB — T-HELPER CELL (CD4) - (RCID CLINIC ONLY)
CD4 % Helper T Cell: 41 % (ref 33–65)
CD4 T Cell Abs: 585 /uL (ref 400–1790)

## 2020-11-24 LAB — COMPREHENSIVE METABOLIC PANEL
AG Ratio: 1.9 (calc) (ref 1.0–2.5)
ALT: 31 U/L (ref 9–46)
AST: 34 U/L (ref 10–35)
Albumin: 3.6 g/dL (ref 3.6–5.1)
Alkaline phosphatase (APISO): 45 U/L (ref 35–144)
BUN: 14 mg/dL (ref 7–25)
CO2: 27 mmol/L (ref 20–32)
Calcium: 9 mg/dL (ref 8.6–10.3)
Chloride: 108 mmol/L (ref 98–110)
Creat: 1.17 mg/dL (ref 0.70–1.35)
Globulin: 1.9 g/dL (calc) (ref 1.9–3.7)
Glucose, Bld: 100 mg/dL — ABNORMAL HIGH (ref 65–99)
Potassium: 5 mmol/L (ref 3.5–5.3)
Sodium: 141 mmol/L (ref 135–146)
Total Bilirubin: 0.5 mg/dL (ref 0.2–1.2)
Total Protein: 5.5 g/dL — ABNORMAL LOW (ref 6.1–8.1)

## 2020-11-24 LAB — LIPID PANEL
Cholesterol: 172 mg/dL (ref <200)
HDL: 59 mg/dL (ref >=40)
LDL Cholesterol (Calc): 96 mg/dL (calc)
Non-HDL Cholesterol (Calc): 113 mg/dL (calc) (ref <130)
Total CHOL/HDL Ratio: 2.9 (calc) (ref <5.0)
Triglycerides: 76 mg/dL (ref <150)

## 2020-11-24 LAB — CBC
HCT: 46.7 % (ref 38.5–50.0)
Hemoglobin: 16.5 g/dL (ref 13.2–17.1)
MCH: 36.5 pg — ABNORMAL HIGH (ref 27.0–33.0)
MCHC: 35.3 g/dL (ref 32.0–36.0)
MCV: 103.3 fL — ABNORMAL HIGH (ref 80.0–100.0)
MPV: 8.4 fL (ref 7.5–12.5)
Platelets: 317 10*3/uL (ref 140–400)
RBC: 4.52 10*6/uL (ref 4.20–5.80)
RDW: 12.2 % (ref 11.0–15.0)
WBC: 6.4 10*3/uL (ref 3.8–10.8)

## 2020-11-24 LAB — HIV-1 RNA QUANT-NO REFLEX-BLD
HIV 1 RNA Quant: NOT DETECTED Copies/mL
HIV-1 RNA Quant, Log: NOT DETECTED Log cps/mL

## 2020-11-24 LAB — RPR: RPR Ser Ql: NONREACTIVE

## 2020-12-05 DIAGNOSIS — L814 Other melanin hyperpigmentation: Secondary | ICD-10-CM | POA: Diagnosis not present

## 2020-12-05 DIAGNOSIS — D225 Melanocytic nevi of trunk: Secondary | ICD-10-CM | POA: Diagnosis not present

## 2020-12-05 DIAGNOSIS — Z85828 Personal history of other malignant neoplasm of skin: Secondary | ICD-10-CM | POA: Diagnosis not present

## 2020-12-05 DIAGNOSIS — L821 Other seborrheic keratosis: Secondary | ICD-10-CM | POA: Diagnosis not present

## 2020-12-09 ENCOUNTER — Ambulatory Visit: Payer: Medicare Other | Admitting: Infectious Diseases

## 2020-12-09 ENCOUNTER — Encounter: Payer: Self-pay | Admitting: Infectious Diseases

## 2020-12-09 ENCOUNTER — Other Ambulatory Visit (HOSPITAL_COMMUNITY): Payer: Self-pay

## 2020-12-09 ENCOUNTER — Other Ambulatory Visit: Payer: Self-pay

## 2020-12-09 ENCOUNTER — Other Ambulatory Visit: Payer: Self-pay | Admitting: Infectious Diseases

## 2020-12-09 VITALS — BP 132/85 | HR 69 | Wt 253.0 lb

## 2020-12-09 DIAGNOSIS — B20 Human immunodeficiency virus [HIV] disease: Secondary | ICD-10-CM | POA: Diagnosis not present

## 2020-12-09 DIAGNOSIS — E669 Obesity, unspecified: Secondary | ICD-10-CM | POA: Diagnosis not present

## 2020-12-09 DIAGNOSIS — Z79899 Other long term (current) drug therapy: Secondary | ICD-10-CM

## 2020-12-09 DIAGNOSIS — I890 Lymphedema, not elsewhere classified: Secondary | ICD-10-CM

## 2020-12-09 DIAGNOSIS — Z113 Encounter for screening for infections with a predominantly sexual mode of transmission: Secondary | ICD-10-CM | POA: Diagnosis not present

## 2020-12-09 MED ORDER — ZOSTER VAC RECOMB ADJUVANTED 50 MCG/0.5ML IM SUSR
0.5000 mL | INTRAMUSCULAR | 1 refills | Status: DC
  Filled 2020-12-19: qty 0.5, 1d supply, fill #0
  Filled 2021-02-20: qty 0.5, 1d supply, fill #1
  Filled ????-??-??: fill #0

## 2020-12-09 MED ORDER — EFAVIRENZ-EMTRICITAB-TENOFOVIR 600-200-300 MG PO TABS: 1.0000 | ORAL_TABLET | Freq: Every day | ORAL | 4 refills | Status: DC

## 2020-12-09 NOTE — Progress Notes (Signed)
   Subjective:    Patient ID: Riley Cortez, male  DOB: 01-25-1956, 65 y.o.        MRN: [id number]   HPI 65 yo M with hx of HIV+, hyperlipidemia, and HTN.  Is on atripla (via assistance fund), lipitor.   Prev TIA Sept 2013. Prev basal cell CA of R leg excised.  Had kidney "boulder" removed 2020 (litho, stent, removal).   Had COVID 03-2020.   Has been traveling more. Going to [place] and [place] this fall.   Question about flu vax (not here yet), shingles vax (will arrange), COVID booster (wait for new booster this fall), not sexually active (no need for monkey pox vax).  Wants 90 day supply from [facility]. Will call them. [phone])   HIV 1 RNA Quant  Date Value  11/22/2020 Not Detected Copies/mL  12/09/2019 25 Copies/mL (H)  10/22/2018 <20 NOT DETECTED copies/mL   CD4 T Cell Abs (/uL)  Date Value  11/22/2020 585  12/09/2019 707  10/22/2018 750     Health Maintenance  Topic Date Due   Zoster Vaccines- Shingrix (1 of 2) Never done   PNA vac Low Risk Adult (2 of 2 - PPSV23) 11/02/2018   COVID-19 Vaccine (2 - Pfizer risk series) 01/12/2020   TETANUS/TDAP  09/21/2020   INFLUENZA VACCINE  11/21/2020   COLONOSCOPY (Pts 45-68yr Insurance coverage will need to be confirmed)  03/07/2025   Hepatitis C Screening  Completed   HPV VACCINES  Aged Out      Review of Systems  Constitutional:  Negative for chills, fever and weight loss.  Respiratory:  Negative for cough and shortness of breath.   Cardiovascular:  Positive for leg swelling (stockings help but hasn't worn for a year. completely resolves o/n).  Gastrointestinal:  Negative for constipation and diarrhea.  Genitourinary:  Negative for dysuria.  Neurological:  Negative for headaches.  Psychiatric/Behavioral:  The patient does not have insomnia.    Please see HPI. All other systems reviewed and negative.     Objective:  Physical Exam Vitals reviewed.  HENT:     Mouth/Throat:     Mouth: Mucous membranes are moist.      Pharynx: No oropharyngeal exudate.  Eyes:     Extraocular Movements: Extraocular movements intact.     Pupils: Pupils are equal, round, and reactive to light.  Cardiovascular:     Rate and Rhythm: Normal rate and regular rhythm.  Pulmonary:     Effort: Pulmonary effort is normal.     Breath sounds: Normal breath sounds.  Abdominal:     General: Bowel sounds are normal. There is no distension.     Palpations: Abdomen is soft.     Tenderness: There is no abdominal tenderness.  Musculoskeletal:        General: Normal range of motion.     Cervical back: Normal range of motion and neck supple.     Right lower leg: Edema present.     Left lower leg: Edema present.  Neurological:     General: No focal deficit present.           Assessment & Plan:

## 2020-12-09 NOTE — Assessment & Plan Note (Signed)
Encouraged diet and exercise.  

## 2020-12-09 NOTE — Assessment & Plan Note (Signed)
Doing very well.  Not sexually active.  Will get him covid booster when available.  Given rx for shingrix.  rtc in 9 months.

## 2020-12-09 NOTE — Assessment & Plan Note (Signed)
Keep elevated Wear compression stockings

## 2020-12-12 ENCOUNTER — Other Ambulatory Visit: Payer: Self-pay

## 2020-12-12 ENCOUNTER — Telehealth: Payer: Self-pay

## 2020-12-12 DIAGNOSIS — B20 Human immunodeficiency virus [HIV] disease: Secondary | ICD-10-CM

## 2020-12-12 MED ORDER — EFAVIRENZ-EMTRICITAB-TENOFOVIR 600-200-300 MG PO TABS: 1.0000 | ORAL_TABLET | Freq: Every day | ORAL | 4 refills | Status: DC

## 2020-12-12 MED ORDER — EFAVIRENZ-EMTRICITAB-TENOFOVIR 600-200-300 MG PO TABS: 1.0000 | ORAL_TABLET | Freq: Every day | ORAL | 1 refills | Status: DC

## 2020-12-12 NOTE — Progress Notes (Signed)
Called in Rx to pharmacy. Spoke with Estill Bamberg, pharmacy rep who was able to take verbal order for 90  day supply of medication. Leatrice Jewels, RMA

## 2020-12-12 NOTE — Telephone Encounter (Signed)
Patient called and said his prescription was sent into the wrong pharmacy efavirenz-emtricitabine-tenofovir ([place].Marland Kitchen   He needs it sent to [facility], the 90 day supply.. Number is: [phone]

## 2020-12-13 ENCOUNTER — Other Ambulatory Visit: Payer: Self-pay

## 2020-12-13 DIAGNOSIS — R269 Unspecified abnormalities of gait and mobility: Secondary | ICD-10-CM | POA: Diagnosis not present

## 2020-12-13 DIAGNOSIS — M25512 Pain in left shoulder: Secondary | ICD-10-CM | POA: Diagnosis not present

## 2020-12-13 DIAGNOSIS — M25551 Pain in right hip: Secondary | ICD-10-CM | POA: Diagnosis not present

## 2020-12-13 DIAGNOSIS — B20 Human immunodeficiency virus [HIV] disease: Secondary | ICD-10-CM

## 2020-12-13 MED ORDER — EFAVIRENZ-EMTRICITAB-TENOFOVIR 600-200-300 MG PO TABS
1.0000 | ORAL_TABLET | Freq: Every day | ORAL | 1 refills | Status: AC
Start: 2020-12-13 — End: 2021-03-13

## 2020-12-14 ENCOUNTER — Telehealth: Payer: Self-pay

## 2020-12-14 NOTE — Telephone Encounter (Signed)
PA initiated for patient's Atripla. Faxed to [employer] [facility].   AT:[phone] Carlean Purl, RN

## 2020-12-16 NOTE — Telephone Encounter (Signed)
Received call today from [place] with [facility] with PA approval information. Medication is approved from 12/09/20-12/09/21. Relayed information to Quillian Quince, PCA with [facility] in [place]. Leatrice Jewels, RMA

## 2020-12-19 ENCOUNTER — Other Ambulatory Visit (HOSPITAL_COMMUNITY): Payer: Self-pay

## 2020-12-21 ENCOUNTER — Other Ambulatory Visit (HOSPITAL_COMMUNITY): Payer: Self-pay

## 2020-12-21 DIAGNOSIS — I2510 Atherosclerotic heart disease of native coronary artery without angina pectoris: Secondary | ICD-10-CM | POA: Diagnosis not present

## 2020-12-21 DIAGNOSIS — E785 Hyperlipidemia, unspecified: Secondary | ICD-10-CM | POA: Diagnosis not present

## 2020-12-21 DIAGNOSIS — I10 Essential (primary) hypertension: Secondary | ICD-10-CM | POA: Diagnosis not present

## 2021-01-03 DIAGNOSIS — I2510 Atherosclerotic heart disease of native coronary artery without angina pectoris: Secondary | ICD-10-CM | POA: Diagnosis not present

## 2021-01-03 DIAGNOSIS — I872 Venous insufficiency (chronic) (peripheral): Secondary | ICD-10-CM | POA: Diagnosis not present

## 2021-01-03 DIAGNOSIS — I10 Essential (primary) hypertension: Secondary | ICD-10-CM | POA: Diagnosis not present

## 2021-01-03 DIAGNOSIS — B20 Human immunodeficiency virus [HIV] disease: Secondary | ICD-10-CM | POA: Diagnosis not present

## 2021-01-04 DIAGNOSIS — M25512 Pain in left shoulder: Secondary | ICD-10-CM | POA: Diagnosis not present

## 2021-01-04 DIAGNOSIS — R269 Unspecified abnormalities of gait and mobility: Secondary | ICD-10-CM | POA: Diagnosis not present

## 2021-01-04 DIAGNOSIS — M25551 Pain in right hip: Secondary | ICD-10-CM | POA: Diagnosis not present

## 2021-01-16 ENCOUNTER — Other Ambulatory Visit: Payer: Self-pay

## 2021-01-16 ENCOUNTER — Ambulatory Visit: Payer: Medicare Other

## 2021-01-16 ENCOUNTER — Ambulatory Visit (INDEPENDENT_AMBULATORY_CARE_PROVIDER_SITE_OTHER): Payer: Medicare Other

## 2021-01-16 DIAGNOSIS — Z23 Encounter for immunization: Secondary | ICD-10-CM

## 2021-01-16 NOTE — Progress Notes (Signed)
   Covid-19 Vaccination Clinic  Name:  MEDARD DECUIR    MRN: [id number] DOB: [birth year]  01/16/2021  Mr. Spellman was observed post Covid-19 immunization for 15 minutes without incident. He was provided with Vaccine Information Sheet and instruction to access the V-Safe system.   Mr. Kope was instructed to call 911 with any severe reactions post vaccine: Difficulty breathing  Swelling of face and throat  A fast heartbeat  A bad rash all over body  Dizziness and weakness     Oluwanifemi Susman T Brooks Sailors

## 2021-01-23 ENCOUNTER — Other Ambulatory Visit (HOSPITAL_COMMUNITY): Payer: Self-pay

## 2021-01-25 DIAGNOSIS — M25551 Pain in right hip: Secondary | ICD-10-CM | POA: Diagnosis not present

## 2021-01-25 DIAGNOSIS — M25512 Pain in left shoulder: Secondary | ICD-10-CM | POA: Diagnosis not present

## 2021-01-25 DIAGNOSIS — R269 Unspecified abnormalities of gait and mobility: Secondary | ICD-10-CM | POA: Diagnosis not present

## 2021-02-14 DIAGNOSIS — M25551 Pain in right hip: Secondary | ICD-10-CM | POA: Diagnosis not present

## 2021-02-14 DIAGNOSIS — M25512 Pain in left shoulder: Secondary | ICD-10-CM | POA: Diagnosis not present

## 2021-02-14 DIAGNOSIS — R269 Unspecified abnormalities of gait and mobility: Secondary | ICD-10-CM | POA: Diagnosis not present

## 2021-02-15 DIAGNOSIS — H524 Presbyopia: Secondary | ICD-10-CM | POA: Diagnosis not present

## 2021-02-16 DIAGNOSIS — Z0000 Encounter for general adult medical examination without abnormal findings: Secondary | ICD-10-CM | POA: Diagnosis not present

## 2021-02-16 DIAGNOSIS — B20 Human immunodeficiency virus [HIV] disease: Secondary | ICD-10-CM | POA: Diagnosis not present

## 2021-02-16 DIAGNOSIS — Z125 Encounter for screening for malignant neoplasm of prostate: Secondary | ICD-10-CM | POA: Diagnosis not present

## 2021-02-16 DIAGNOSIS — Z131 Encounter for screening for diabetes mellitus: Secondary | ICD-10-CM | POA: Diagnosis not present

## 2021-02-16 DIAGNOSIS — E785 Hyperlipidemia, unspecified: Secondary | ICD-10-CM | POA: Diagnosis not present

## 2021-02-20 ENCOUNTER — Other Ambulatory Visit (HOSPITAL_COMMUNITY): Payer: Self-pay

## 2021-02-21 ENCOUNTER — Other Ambulatory Visit (HOSPITAL_COMMUNITY): Payer: Self-pay

## 2021-03-08 DIAGNOSIS — M25512 Pain in left shoulder: Secondary | ICD-10-CM | POA: Diagnosis not present

## 2021-03-08 DIAGNOSIS — M25551 Pain in right hip: Secondary | ICD-10-CM | POA: Diagnosis not present

## 2021-03-08 DIAGNOSIS — R269 Unspecified abnormalities of gait and mobility: Secondary | ICD-10-CM | POA: Diagnosis not present

## 2021-04-05 DIAGNOSIS — M25512 Pain in left shoulder: Secondary | ICD-10-CM | POA: Diagnosis not present

## 2021-04-05 DIAGNOSIS — M25551 Pain in right hip: Secondary | ICD-10-CM | POA: Diagnosis not present

## 2021-04-05 DIAGNOSIS — R269 Unspecified abnormalities of gait and mobility: Secondary | ICD-10-CM | POA: Diagnosis not present

## 2021-05-03 DIAGNOSIS — R269 Unspecified abnormalities of gait and mobility: Secondary | ICD-10-CM | POA: Diagnosis not present

## 2021-05-03 DIAGNOSIS — M25512 Pain in left shoulder: Secondary | ICD-10-CM | POA: Diagnosis not present

## 2021-05-03 DIAGNOSIS — M25551 Pain in right hip: Secondary | ICD-10-CM | POA: Diagnosis not present

## 2021-05-09 DIAGNOSIS — I10 Essential (primary) hypertension: Secondary | ICD-10-CM | POA: Diagnosis not present

## 2021-05-09 DIAGNOSIS — I2510 Atherosclerotic heart disease of native coronary artery without angina pectoris: Secondary | ICD-10-CM | POA: Diagnosis not present

## 2021-05-09 DIAGNOSIS — Z8673 Personal history of transient ischemic attack (TIA), and cerebral infarction without residual deficits: Secondary | ICD-10-CM | POA: Diagnosis not present

## 2021-05-09 DIAGNOSIS — I872 Venous insufficiency (chronic) (peripheral): Secondary | ICD-10-CM | POA: Diagnosis not present

## 2021-05-17 ENCOUNTER — Other Ambulatory Visit: Payer: Self-pay | Admitting: Infectious Diseases

## 2021-05-17 DIAGNOSIS — M25512 Pain in left shoulder: Secondary | ICD-10-CM | POA: Diagnosis not present

## 2021-05-17 DIAGNOSIS — B20 Human immunodeficiency virus [HIV] disease: Secondary | ICD-10-CM

## 2021-05-17 DIAGNOSIS — M25551 Pain in right hip: Secondary | ICD-10-CM | POA: Diagnosis not present

## 2021-05-17 DIAGNOSIS — R269 Unspecified abnormalities of gait and mobility: Secondary | ICD-10-CM | POA: Diagnosis not present

## 2021-05-19 ENCOUNTER — Other Ambulatory Visit: Payer: Self-pay | Admitting: Infectious Diseases

## 2021-05-19 DIAGNOSIS — B20 Human immunodeficiency virus [HIV] disease: Secondary | ICD-10-CM

## 2021-05-31 DIAGNOSIS — M25551 Pain in right hip: Secondary | ICD-10-CM | POA: Diagnosis not present

## 2021-05-31 DIAGNOSIS — R269 Unspecified abnormalities of gait and mobility: Secondary | ICD-10-CM | POA: Diagnosis not present

## 2021-06-14 DIAGNOSIS — R269 Unspecified abnormalities of gait and mobility: Secondary | ICD-10-CM | POA: Diagnosis not present

## 2021-06-14 DIAGNOSIS — M25551 Pain in right hip: Secondary | ICD-10-CM | POA: Diagnosis not present

## 2021-06-19 ENCOUNTER — Other Ambulatory Visit (HOSPITAL_COMMUNITY): Payer: Self-pay

## 2021-06-20 ENCOUNTER — Other Ambulatory Visit (HOSPITAL_COMMUNITY): Payer: Self-pay

## 2021-06-27 DIAGNOSIS — R269 Unspecified abnormalities of gait and mobility: Secondary | ICD-10-CM | POA: Diagnosis not present

## 2021-06-27 DIAGNOSIS — M25551 Pain in right hip: Secondary | ICD-10-CM | POA: Diagnosis not present

## 2021-08-01 DIAGNOSIS — R269 Unspecified abnormalities of gait and mobility: Secondary | ICD-10-CM | POA: Diagnosis not present

## 2021-08-01 DIAGNOSIS — M25551 Pain in right hip: Secondary | ICD-10-CM | POA: Diagnosis not present

## 2021-08-09 ENCOUNTER — Other Ambulatory Visit: Payer: Self-pay | Admitting: Infectious Diseases

## 2021-08-09 DIAGNOSIS — B20 Human immunodeficiency virus [HIV] disease: Secondary | ICD-10-CM

## 2021-08-11 ENCOUNTER — Other Ambulatory Visit: Payer: Self-pay | Admitting: Infectious Diseases

## 2021-08-11 DIAGNOSIS — B20 Human immunodeficiency virus [HIV] disease: Secondary | ICD-10-CM

## 2021-08-29 DIAGNOSIS — M25551 Pain in right hip: Secondary | ICD-10-CM | POA: Diagnosis not present

## 2021-08-29 DIAGNOSIS — R269 Unspecified abnormalities of gait and mobility: Secondary | ICD-10-CM | POA: Diagnosis not present

## 2021-09-05 DIAGNOSIS — I10 Essential (primary) hypertension: Secondary | ICD-10-CM | POA: Diagnosis not present

## 2021-09-05 DIAGNOSIS — I2510 Atherosclerotic heart disease of native coronary artery without angina pectoris: Secondary | ICD-10-CM | POA: Diagnosis not present

## 2021-09-05 DIAGNOSIS — E785 Hyperlipidemia, unspecified: Secondary | ICD-10-CM | POA: Diagnosis not present

## 2021-09-05 DIAGNOSIS — Z8673 Personal history of transient ischemic attack (TIA), and cerebral infarction without residual deficits: Secondary | ICD-10-CM | POA: Diagnosis not present

## 2021-09-13 DIAGNOSIS — R269 Unspecified abnormalities of gait and mobility: Secondary | ICD-10-CM | POA: Diagnosis not present

## 2021-09-13 DIAGNOSIS — M25551 Pain in right hip: Secondary | ICD-10-CM | POA: Diagnosis not present

## 2021-09-21 ENCOUNTER — Other Ambulatory Visit: Payer: Self-pay | Admitting: Infectious Diseases

## 2021-09-21 ENCOUNTER — Telehealth: Payer: Self-pay

## 2021-09-21 DIAGNOSIS — I890 Lymphedema, not elsewhere classified: Secondary | ICD-10-CM

## 2021-09-21 DIAGNOSIS — B20 Human immunodeficiency virus [HIV] disease: Secondary | ICD-10-CM

## 2021-09-21 NOTE — Telephone Encounter (Signed)
Patient left a voicemail asking Dr.Hatcher for a referral to see Dr.Early due to swelling in his ankle. Please advise.    [place], CMA

## 2021-09-22 NOTE — Telephone Encounter (Signed)
Patient aware.  Riley Cortez, CMA  

## 2021-10-03 DIAGNOSIS — M25551 Pain in right hip: Secondary | ICD-10-CM | POA: Diagnosis not present

## 2021-10-03 DIAGNOSIS — R269 Unspecified abnormalities of gait and mobility: Secondary | ICD-10-CM | POA: Diagnosis not present

## 2021-10-11 ENCOUNTER — Encounter: Payer: Self-pay | Admitting: Infectious Diseases

## 2021-10-11 DIAGNOSIS — R269 Unspecified abnormalities of gait and mobility: Secondary | ICD-10-CM | POA: Diagnosis not present

## 2021-10-11 DIAGNOSIS — M25551 Pain in right hip: Secondary | ICD-10-CM | POA: Diagnosis not present

## 2021-10-12 ENCOUNTER — Encounter: Payer: Self-pay | Admitting: Infectious Diseases

## 2021-10-12 ENCOUNTER — Ambulatory Visit (INDEPENDENT_AMBULATORY_CARE_PROVIDER_SITE_OTHER): Payer: Medicare Other

## 2021-10-12 ENCOUNTER — Ambulatory Visit: Payer: Medicare Other | Admitting: Infectious Diseases

## 2021-10-12 ENCOUNTER — Other Ambulatory Visit: Payer: Self-pay

## 2021-10-12 VITALS — BP 102/64 | HR 95 | Resp 16 | Ht 68.0 in | Wt 241.0 lb

## 2021-10-12 DIAGNOSIS — I890 Lymphedema, not elsewhere classified: Secondary | ICD-10-CM | POA: Diagnosis not present

## 2021-10-12 DIAGNOSIS — Z113 Encounter for screening for infections with a predominantly sexual mode of transmission: Secondary | ICD-10-CM | POA: Diagnosis not present

## 2021-10-12 DIAGNOSIS — Z6836 Body mass index (BMI) 36.0-36.9, adult: Secondary | ICD-10-CM

## 2021-10-12 DIAGNOSIS — Z79899 Other long term (current) drug therapy: Secondary | ICD-10-CM | POA: Diagnosis not present

## 2021-10-12 DIAGNOSIS — Z23 Encounter for immunization: Secondary | ICD-10-CM | POA: Diagnosis not present

## 2021-10-12 DIAGNOSIS — B20 Human immunodeficiency virus [HIV] disease: Secondary | ICD-10-CM | POA: Diagnosis not present

## 2021-10-12 DIAGNOSIS — E669 Obesity, unspecified: Secondary | ICD-10-CM

## 2021-10-12 NOTE — Assessment & Plan Note (Signed)
He is doing very well Will refill his Atripla with [employer] (member #[id number]) in August [phone]) Will give him mPox, PCV20, Tdap.  Will review his labs (done today) Plan to see him when COVID/flu vax are updated rtc in 1 year.

## 2021-10-12 NOTE — Assessment & Plan Note (Signed)
Appreciate his f/u with Dr Donnetta Hutching

## 2021-10-12 NOTE — Progress Notes (Signed)
    [facility]  Name:  Riley Cortez    MRN: [id number] DOB: 07/14/1955   10/12/2021  [name] was observed post JYNNEOS immunization for 15 minutes without incident. He was provided with Vaccine Information Sheet and instruction to access the V-Safe system.   [name] was instructed to call 911 with any severe reactions post vaccine: Difficulty breathing  Swelling of face and throat  A fast heartbeat  A bad rash all over body  Dizziness and weakness   Lexie Koehl CMA

## 2021-10-12 NOTE — Addendum Note (Signed)
Addended by: Theresia Majors A on: 10/12/2021 05:01 PM   Modules accepted: Orders

## 2021-10-12 NOTE — Assessment & Plan Note (Signed)
Has lost 15# and I encouraged him!

## 2021-10-13 ENCOUNTER — Telehealth: Payer: Self-pay | Admitting: Infectious Diseases

## 2021-10-13 ENCOUNTER — Telehealth: Payer: Self-pay

## 2021-10-13 ENCOUNTER — Other Ambulatory Visit: Payer: Self-pay

## 2021-10-13 DIAGNOSIS — B20 Human immunodeficiency virus [HIV] disease: Secondary | ICD-10-CM

## 2021-10-13 LAB — T-HELPER CELL (CD4) - (RCID CLINIC ONLY)
CD4 % Helper T Cell: 38 % (ref 33–65)
CD4 T Cell Abs: 614 /uL (ref 400–1790)

## 2021-10-14 ENCOUNTER — Other Ambulatory Visit: Payer: Self-pay

## 2021-10-14 DIAGNOSIS — I890 Lymphedema, not elsewhere classified: Secondary | ICD-10-CM

## 2021-10-15 LAB — COMPREHENSIVE METABOLIC PANEL
AG Ratio: 1.9 (calc) (ref 1.0–2.5)
ALT: 23 U/L (ref 9–46)
AST: 26 U/L (ref 10–35)
Albumin: 3.6 g/dL (ref 3.6–5.1)
Alkaline phosphatase (APISO): 39 U/L (ref 35–144)
BUN/Creatinine Ratio: 11 (calc) (ref 6–22)
BUN: 16 mg/dL (ref 7–25)
CO2: 26 mmol/L (ref 20–32)
Calcium: 8.9 mg/dL (ref 8.6–10.3)
Chloride: 107 mmol/L (ref 98–110)
Creat: 1.49 mg/dL — ABNORMAL HIGH (ref 0.70–1.35)
Globulin: 1.9 g/dL (calc) (ref 1.9–3.7)
Glucose, Bld: 100 mg/dL — ABNORMAL HIGH (ref 65–99)
Potassium: 4.6 mmol/L (ref 3.5–5.3)
Sodium: 140 mmol/L (ref 135–146)
Total Bilirubin: 0.4 mg/dL (ref 0.2–1.2)
Total Protein: 5.5 g/dL — ABNORMAL LOW (ref 6.1–8.1)

## 2021-10-15 LAB — CBC
HCT: 46 % (ref 38.5–50.0)
Hemoglobin: 16.2 g/dL (ref 13.2–17.1)
MCH: 36.2 pg — ABNORMAL HIGH (ref 27.0–33.0)
MCHC: 35.2 g/dL (ref 32.0–36.0)
MCV: 102.7 fL — ABNORMAL HIGH (ref 80.0–100.0)
MPV: 8.5 fL (ref 7.5–12.5)
Platelets: 323 10*3/uL (ref 140–400)
RBC: 4.48 10*6/uL (ref 4.20–5.80)
RDW: 12 % (ref 11.0–15.0)
WBC: 5.9 10*3/uL (ref 3.8–10.8)

## 2021-10-15 LAB — LIPID PANEL
Cholesterol: 161 mg/dL (ref <200)
HDL: 54 mg/dL (ref >=40)
LDL Cholesterol (Calc): 90 mg/dL (calc)
Non-HDL Cholesterol (Calc): 107 mg/dL (calc) (ref <130)
Total CHOL/HDL Ratio: 3 (calc) (ref <5.0)
Triglycerides: 83 mg/dL (ref <150)

## 2021-10-15 LAB — HIV-1 RNA QUANT-NO REFLEX-BLD
HIV 1 RNA Quant: NOT DETECTED Copies/mL
HIV-1 RNA Quant, Log: NOT DETECTED Log cps/mL

## 2021-10-15 LAB — RPR: RPR Ser Ql: NONREACTIVE

## 2021-10-16 ENCOUNTER — Other Ambulatory Visit: Payer: Medicare Other

## 2021-10-16 ENCOUNTER — Other Ambulatory Visit: Payer: Self-pay

## 2021-10-16 DIAGNOSIS — B20 Human immunodeficiency virus [HIV] disease: Secondary | ICD-10-CM | POA: Diagnosis not present

## 2021-10-16 LAB — BASIC METABOLIC PANEL
BUN: 14 mg/dL (ref 7–25)
CO2: 28 mmol/L (ref 20–32)
Calcium: 9.2 mg/dL (ref 8.6–10.3)
Chloride: 107 mmol/L (ref 98–110)
Creat: 1.24 mg/dL (ref 0.70–1.35)
Glucose, Bld: 96 mg/dL (ref 65–99)
Potassium: 4.7 mmol/L (ref 3.5–5.3)
Sodium: 140 mmol/L (ref 135–146)

## 2021-10-31 ENCOUNTER — Other Ambulatory Visit: Payer: Self-pay | Admitting: Infectious Diseases

## 2021-10-31 DIAGNOSIS — B20 Human immunodeficiency virus [HIV] disease: Secondary | ICD-10-CM

## 2021-11-01 ENCOUNTER — Telehealth: Payer: Self-pay

## 2021-11-01 DIAGNOSIS — R269 Unspecified abnormalities of gait and mobility: Secondary | ICD-10-CM | POA: Diagnosis not present

## 2021-11-01 DIAGNOSIS — M25551 Pain in right hip: Secondary | ICD-10-CM | POA: Diagnosis not present

## 2021-11-01 NOTE — Telephone Encounter (Signed)
90 tabs sent to [facility] yesterday. Notified patient. States last year, Dr. Johnnye Sima had to do a PA for his insurance to pay for 90 days. He will call [facility] and ask them to fax info to initiate PA.

## 2021-11-01 NOTE — Telephone Encounter (Signed)
Patient would like 90 day supply and currently [facility] will only give him 30 day unless form is completed.

## 2021-11-01 NOTE — Telephone Encounter (Signed)
Patient called back stating he spoke to [facility] and insurance will only cover 30 days. States prior PA was good through 12/09/21. [id number]. States they did not have a form to fax to [place].

## 2021-11-01 NOTE — Telephone Encounter (Signed)
Pt states he need the efavirenz-emtricitab-tenofovir (ATRIPLA) 600-200-300 MG tablet for 90 days supply. Pt is using [facility] Passenger transport manager) [place], [place]. Please call pt back.

## 2021-11-06 ENCOUNTER — Telehealth: Payer: Self-pay

## 2021-11-06 DIAGNOSIS — B20 Human immunodeficiency virus [HIV] disease: Secondary | ICD-10-CM

## 2021-11-07 ENCOUNTER — Other Ambulatory Visit: Payer: Self-pay | Admitting: Infectious Diseases

## 2021-11-07 DIAGNOSIS — B20 Human immunodeficiency virus [HIV] disease: Secondary | ICD-10-CM

## 2021-11-07 MED ORDER — EFAVIRENZ-EMTRICITAB-TENOFO DF 600-200-300 MG PO TABS: 1.0000 | ORAL_TABLET | Freq: Every day | ORAL | 4 refills | Status: DC

## 2021-11-08 NOTE — Telephone Encounter (Signed)
Spoke to patient requesting PA for 90 supply of Atripla.  Call to [facility] given information for PA.  Patient's insurance company will only approve 30 day supply.  Information to be faxed t the Clinics to do an override for a 90 day supply.   Spoke to C. Hawkins Tech to follow up on.

## 2021-11-10 ENCOUNTER — Ambulatory Visit (HOSPITAL_COMMUNITY)
Admission: RE | Admit: 2021-11-10 | Discharge: 2021-11-10 | Disposition: A | Discharge status: Home / Self Care | Payer: Medicare Other | Source: Ambulatory Visit | Attending: Vascular Surgery | Admitting: Vascular Surgery

## 2021-11-10 DIAGNOSIS — I890 Lymphedema, not elsewhere classified: Secondary | ICD-10-CM | POA: Diagnosis not present

## 2021-11-13 ENCOUNTER — Other Ambulatory Visit: Payer: Self-pay

## 2021-11-13 ENCOUNTER — Ambulatory Visit (INDEPENDENT_AMBULATORY_CARE_PROVIDER_SITE_OTHER): Payer: Medicare Other

## 2021-11-13 ENCOUNTER — Other Ambulatory Visit (HOSPITAL_COMMUNITY): Payer: Self-pay

## 2021-11-13 DIAGNOSIS — Z23 Encounter for immunization: Secondary | ICD-10-CM

## 2021-11-13 DIAGNOSIS — B20 Human immunodeficiency virus [HIV] disease: Secondary | ICD-10-CM | POA: Diagnosis not present

## 2021-11-13 NOTE — Progress Notes (Signed)
    [facility]  Name:  Riley Cortez    MRN: [id number] DOB: 10/11/1955   11/13/2021  [name] declined observation post [place] immunization #2. He was provided with Vaccine Information Sheet and instruction to access the V-Safe system.   [name] was instructed to call 911 with any severe reactions post vaccine: Difficulty breathing  Swelling of face and throat  A fast heartbeat  A bad rash all over body  Dizziness and weakness   Binnie Kand, RN

## 2021-11-13 NOTE — Telephone Encounter (Signed)
Requesting to speak with a nurse about medication. Please call back.  

## 2021-11-15 NOTE — Telephone Encounter (Signed)
Pt's calling back - asking about getting a 90 day supply instead of 30 days for Atripla. He would like a call back.  Rosendo Gros, pharmacy - stated she unaware but will start the process.

## 2021-11-17 ENCOUNTER — Other Ambulatory Visit (HOSPITAL_COMMUNITY): Payer: Self-pay

## 2021-11-17 ENCOUNTER — Telehealth: Payer: Self-pay

## 2021-11-17 DIAGNOSIS — B20 Human immunodeficiency virus [HIV] disease: Secondary | ICD-10-CM

## 2021-11-17 NOTE — Telephone Encounter (Signed)
A Prior Authorization was initiated for this patients ATRIPLA through CoverMyMeds.   Key: [id number]

## 2021-11-20 NOTE — Telephone Encounter (Signed)
Prior Auth for patients medication ATRIPLA approved by [employer] PART D from 11/17/21 to 11/18/22.  Key: [id number]  APPROVAL LETTER SIGNED TO PT'S CHART

## 2021-11-21 DIAGNOSIS — R269 Unspecified abnormalities of gait and mobility: Secondary | ICD-10-CM | POA: Diagnosis not present

## 2021-11-21 DIAGNOSIS — M25551 Pain in right hip: Secondary | ICD-10-CM | POA: Diagnosis not present

## 2021-11-23 ENCOUNTER — Other Ambulatory Visit (HOSPITAL_COMMUNITY): Payer: Self-pay

## 2021-11-23 ENCOUNTER — Telehealth: Payer: Self-pay

## 2021-11-23 NOTE — Telephone Encounter (Signed)
[facility] Patient Advocate Encounter   I was successful in securing patient a $ 10,000.00 grant from [employer] to provide copayment coverage for Atripla.  The patient's out of pocket cost will be $0.00 monthly.     I have spoken with the patient.        Dates of Eligibility: 11/23/21 through 04/22/22  Patient knows to call the office with questions or concerns.  [name], [facility] Patient [facility] Phone: [phone] Fax:  [phone]

## 2021-11-24 ENCOUNTER — Telehealth: Payer: Self-pay

## 2021-11-24 NOTE — Telephone Encounter (Signed)
[facility] Patient Advocate Encounter   I was successful in securing patient a $7500 grant from [employer] [place] (PAF) to provide copayment coverage for ATRIPLA.  This will make the out of pocket cost $0.00.     I have spoken with the patient.     RxBin: [id number] PCN:   [id number] Member ID: [id number] Group ID: [id number] Dates of Eligibility: 11/12/21 through 11/24/22  Patient knows to call the office with questions or concerns.  [name], [facility] Patient [name] Phone: [phone] Fax:  [phone]

## 2021-11-27 NOTE — Addendum Note (Signed)
Addended by: Velora Heckler on: 11/27/2021 11:45 AM   Modules accepted: Orders

## 2021-11-27 NOTE — Telephone Encounter (Addendum)
Received call from Rhod with [place] Dept requesting 90 day supply of Atripla be sent to [facility]. Rx written on 11/07/21 was set to Print. Please resend as Normal. Thank you.

## 2021-11-29 NOTE — Progress Notes (Signed)
Requested by:  Campbell Riches, MD [address],  [place] [address]  Reason for consultation: re evaluation of RLE swelling    History of Present Illness   Riley Cortez is a 66 y.o. (15-Aug-1955) male who presents for evaluation of right lower extremity swelling. He was evaluated in August of 2019 by Dr. Donnetta Hutching for the same concerns. He explains that his swelling is really about the same as it has been. It is more noticeable at the end of the day and after prolonged standing or ambulation. It is improved upon first waking or with elevation, although he explains that he elevates on an ottoman. He has 20-30 mmHg knee high compression stockings but does not wear them. Explains that they are hot and unappealing. He otherwise denies any throbbing, pain, aching, itching, stinging, burning, bleeding, ulcer, or other skin changes.   Venous symptoms include: swelling Onset/duration:  > 5 years  Occupation:  retired Aggravating factors: sitting, standing, prolonged ambulation Alleviating factors: elevation Compression: yes, does not like to wear Helps:  n/a Pain medications:  none Previous vein procedures:  no History of DVT:  no  Past Medical History:  Diagnosis Date   Cancer ([place])    skin   HIV infection ([place])    Hyperlipidemia    Myocardial infarct [facility])     Past Surgical History:  Procedure Laterality Date   NASAL SEPTUM SURGERY     PTCA     skin cancer removal      Social History   Socioeconomic History   Marital status: Single    Spouse name: Not on file   Number of children: Not on file   Years of education: Not on file   Highest education level: Not on file  Occupational History   Not on file  Tobacco Use   Smoking status: Never   Smokeless tobacco: Never  Vaping Use   Vaping Use: Never used  Substance and Sexual Activity   Alcohol use: Yes    Alcohol/week: 0.0 standard drinks of alcohol    Comment: socially   Drug use: No   Sexual activity:  Yes    Partners: Male    Comment: pt. given condoms  Other Topics Concern   Not on file  Social History Narrative   Not on file   Social Determinants of Health   Financial Resource Strain: Not on file  Food Insecurity: Not on file  Transportation Needs: Not on file  Physical Activity: Not on file  Stress: Not on file  Social Connections: Not on file  Intimate Partner Violence: Not on file    Family History  Problem Relation Age of Onset   Alzheimer's disease Mother    Heart disease Father    Factor V Leiden deficiency Brother    Pneumonia Brother        COVID   Colon polyps Sister     Current Outpatient Medications  Medication Sig Dispense Refill   aspirin 325 MG tablet Take 325 mg by mouth daily.     atorvastatin (LIPITOR) 80 MG tablet Take 80 mg by mouth at bedtime.     b complex vitamins capsule       Choline Fenofibrate 135 MG capsule Take 135 mg by mouth daily.     efavirenz-emtricitab-tenofovir (ATRIPLA) 600-200-300 MG tablet Take 1 tablet by mouth at bedtime. 90 tablet 4   fenofibrate 160 MG tablet Take 160 mg by mouth daily.     metoprolol succinate (  TOPROL-XL) 25 MG 24 hr tablet Take 25 mg by mouth daily.     Multiple Vitamins-Minerals (MULTIVITAMIN WITH MINERALS) tablet Take by mouth.     Omega 3 1000 MG CAPS 1 capsule Orally Once a day for 30 day(s)     ramipril (ALTACE) 5 MG capsule 1 capsule Orally Once a day for 30 day(s)     valACYclovir (VALTREX) 1000 MG tablet TAKE 2 TABLETS BY MOUTH TWICE DAILY FOR 5 DAYS AT OUTSET OF FEVER BLISTERS (Patient not taking: Reported on 12/04/2021) 20 tablet 5   No current facility-administered medications for this visit.    No Known Allergies  REVIEW OF SYSTEMS (negative unless checked):   Cardiac:  '[]'$  Chest pain or chest pressure? '[]'$  Shortness of breath upon activity? '[]'$  Shortness of breath when lying flat? '[]'$  Irregular heart rhythm?  Vascular:  '[]'$  Pain in calf, thigh, or hip brought on by walking? '[]'$  Pain in  feet at night that wakes you up from your sleep? '[]'$  Blood clot in your veins? '[x]'$  Leg swelling?  Pulmonary:  '[]'$  Oxygen at home? '[]'$  Productive cough? '[]'$  Wheezing?  Neurologic:  '[]'$  Sudden weakness in arms or legs? '[]'$  Sudden numbness in arms or legs? '[]'$  Sudden onset of difficult speaking or slurred speech? '[]'$  Temporary loss of vision in one eye? '[]'$  Problems with dizziness?  Gastrointestinal:  '[]'$  Blood in stool? '[]'$  Vomited blood?  Genitourinary:  '[]'$  Burning when urinating? '[]'$  Blood in urine?  Psychiatric:  '[]'$  Major depression  Hematologic:  '[]'$  Bleeding problems? '[]'$  Problems with blood clotting?  Dermatologic:  '[]'$  Rashes or ulcers?  Constitutional:  '[]'$  Fever or chills?  Ear/Nose/Throat:  '[]'$  Change in hearing? '[]'$  Nose bleeds? '[]'$  Sore throat?  Musculoskeletal:  '[]'$  Back pain? '[]'$  Joint pain? '[]'$  Muscle pain?   Physical Examination     Vitals:   12/04/21 0958  BP: 127/70  Pulse: 65  Resp: 16  Temp: 98 F (36.7 C)  TempSrc: Temporal  SpO2: 96%  Weight: 233 lb (105.7 kg)  Height: '5\' 8"'$  (1.727 m)   Body mass index is 35.43 kg/m.  General:  WDWN in NAD; vital signs documented above Gait: Normal HENT: WNL, normocephalic Pulmonary: normal non-labored breathing, without wheezing Cardiac: regular HR Extremities: 2+ DP pulses, without varicose veins, without reticular veins, with edema of right distal leg, right ankle and dorsum of foot, without stasis pigmentation, without lipodermatosclerosis, without ulcers Musculoskeletal: no muscle wasting or atrophy  Neurologic: A&O X 3;  No focal weakness or paresthesias are detected Psychiatric:  The pt has Normal affect.  Non-invasive Vascular Imaging   BLE Venous Insufficiency Duplex (11/10/21):  RLE:  No DVT and SVT,  GSV reflux in proximal calf GSV diameter 0.29-0.46 SSV reflux mid calf No deep venous reflux   Medical Decision Making   Riley Cortez is a 66 y.o. male who presents with: RLE chronic  venous insufficiency with swelling. He likely has component of lymphedema as well. His duplex today shows very minimal venous insufficiency mostly in the GSV and SSV in calf. No DVT or SVT. No deep reflux. Based on his duplex he is not a candidate for any further more invasive intervention like a venous ablation. I have re recommended continued conservative therapy with proper elevation daily above the level of his heart, 20-30 mmHg knee high compression stockings, exercise, and refraining from prolonged sitting or standing. I have explained that this is lifelong and can also progress over time. He will follow up as needed  or if he has any new or worsening symptoms    Karoline Caldwell, PA-C [facility]: [phone]  12/04/2021, 10:30 AM  Clinic MD: Trula Slade

## 2021-12-04 ENCOUNTER — Ambulatory Visit: Payer: Medicare Other | Admitting: Physician Assistant

## 2021-12-04 VITALS — BP 127/70 | HR 65 | Temp 98.0°F | Resp 16 | Ht 68.0 in | Wt 233.0 lb

## 2021-12-04 DIAGNOSIS — I890 Lymphedema, not elsewhere classified: Secondary | ICD-10-CM | POA: Diagnosis not present

## 2021-12-04 DIAGNOSIS — I872 Venous insufficiency (chronic) (peripheral): Secondary | ICD-10-CM

## 2021-12-05 DIAGNOSIS — L814 Other melanin hyperpigmentation: Secondary | ICD-10-CM | POA: Diagnosis not present

## 2021-12-05 DIAGNOSIS — Z85828 Personal history of other malignant neoplasm of skin: Secondary | ICD-10-CM | POA: Diagnosis not present

## 2021-12-05 DIAGNOSIS — L821 Other seborrheic keratosis: Secondary | ICD-10-CM | POA: Diagnosis not present

## 2021-12-05 DIAGNOSIS — R269 Unspecified abnormalities of gait and mobility: Secondary | ICD-10-CM | POA: Diagnosis not present

## 2021-12-05 DIAGNOSIS — M25551 Pain in right hip: Secondary | ICD-10-CM | POA: Diagnosis not present

## 2021-12-05 DIAGNOSIS — D225 Melanocytic nevi of trunk: Secondary | ICD-10-CM | POA: Diagnosis not present

## 2021-12-18 ENCOUNTER — Other Ambulatory Visit: Payer: Self-pay | Admitting: Infectious Diseases

## 2021-12-18 DIAGNOSIS — B20 Human immunodeficiency virus [HIV] disease: Secondary | ICD-10-CM

## 2021-12-18 NOTE — Telephone Encounter (Signed)
Called pt who is requesting a 90 day supply of Atripla. Pharmacy is [facility]. Sending request to [place]. Stated last year through the PAF grant he was able to get a 90 day supply. Thanks

## 2021-12-18 NOTE — Telephone Encounter (Signed)
Refill Request- Pt rec'd a 30 days supply instead of a 90 day supply.  Pt would like for someone to call him b ack in reference to his refill.  efavirenz-emtricitab-tenofovir (ATRIPLA) 600-200-300 MG tablet   [facility] in [place], [place] Address: [address], [place], [place]  Phone [phone]  Alternate number [phone]

## 2021-12-19 ENCOUNTER — Other Ambulatory Visit: Payer: Self-pay | Admitting: Infectious Diseases

## 2021-12-19 ENCOUNTER — Telehealth: Payer: Self-pay

## 2021-12-19 DIAGNOSIS — B20 Human immunodeficiency virus [HIV] disease: Secondary | ICD-10-CM

## 2021-12-19 MED ORDER — EFAVIRENZ-EMTRICITAB-TENOFO DF 600-200-300 MG PO TABS: 1.0000 | ORAL_TABLET | Freq: Every day | ORAL | 4 refills | Status: DC

## 2021-12-19 NOTE — Telephone Encounter (Signed)
Pt calling back to speak with a nurse, please call pt back.

## 2021-12-19 NOTE — Telephone Encounter (Signed)
Requesting to speak with a nurse about efavirenz-emtricitab-tenofovir (ATRIPLA) 600-200-300 MG tablet. Please call pt back.

## 2021-12-19 NOTE — Telephone Encounter (Signed)
Pt stated the [facility] pharmacy in [facility] had not received Atripla rx. According to the chart it was received this am @ 1101 AM. Receipt confirmed by pharmacy (12/19/2021 11:01 AM EDT    I called [facility] in [place] - talked to [facility] who stated  Efavirenz-emtricitab-tenofovir 600-200-300 mg qty#90 x 4 RF's was received and in process. And she stated if pt has not heard anything within 24 hr to give them a call back. I called pt back to inform him.

## 2021-12-19 NOTE — Telephone Encounter (Signed)
Return pt's call - no answer; left message on self-identified vm I had called and rx has been sent to [facility] for 90 days supply and to call the office back if needed.

## 2021-12-20 DIAGNOSIS — M25551 Pain in right hip: Secondary | ICD-10-CM | POA: Diagnosis not present

## 2021-12-20 DIAGNOSIS — R269 Unspecified abnormalities of gait and mobility: Secondary | ICD-10-CM | POA: Diagnosis not present

## 2022-01-01 ENCOUNTER — Other Ambulatory Visit (HOSPITAL_COMMUNITY): Payer: Self-pay

## 2022-01-01 ENCOUNTER — Other Ambulatory Visit (HOSPITAL_BASED_OUTPATIENT_CLINIC_OR_DEPARTMENT_OTHER): Payer: Self-pay

## 2022-01-03 DIAGNOSIS — R269 Unspecified abnormalities of gait and mobility: Secondary | ICD-10-CM | POA: Diagnosis not present

## 2022-01-03 DIAGNOSIS — I2510 Atherosclerotic heart disease of native coronary artery without angina pectoris: Secondary | ICD-10-CM | POA: Diagnosis not present

## 2022-01-03 DIAGNOSIS — M25551 Pain in right hip: Secondary | ICD-10-CM | POA: Diagnosis not present

## 2022-01-03 DIAGNOSIS — E785 Hyperlipidemia, unspecified: Secondary | ICD-10-CM | POA: Diagnosis not present

## 2022-01-03 DIAGNOSIS — I10 Essential (primary) hypertension: Secondary | ICD-10-CM | POA: Diagnosis not present

## 2022-01-09 DIAGNOSIS — E785 Hyperlipidemia, unspecified: Secondary | ICD-10-CM | POA: Diagnosis not present

## 2022-01-09 DIAGNOSIS — Z8673 Personal history of transient ischemic attack (TIA), and cerebral infarction without residual deficits: Secondary | ICD-10-CM | POA: Diagnosis not present

## 2022-01-09 DIAGNOSIS — I2510 Atherosclerotic heart disease of native coronary artery without angina pectoris: Secondary | ICD-10-CM | POA: Diagnosis not present

## 2022-01-09 DIAGNOSIS — I10 Essential (primary) hypertension: Secondary | ICD-10-CM | POA: Diagnosis not present

## 2022-01-17 ENCOUNTER — Telehealth: Payer: Self-pay

## 2022-01-17 DIAGNOSIS — M25551 Pain in right hip: Secondary | ICD-10-CM | POA: Diagnosis not present

## 2022-01-17 DIAGNOSIS — R269 Unspecified abnormalities of gait and mobility: Secondary | ICD-10-CM | POA: Diagnosis not present

## 2022-01-17 NOTE — Telephone Encounter (Signed)
Pt called to f/u on his copay assistance.   Pt is due for refill but copay card is not assisting at the moment. Company needs provider signature for authorization. Form is in Dr [employer] box awaiting signature.   Pt would like a call back once form has been faxed back to company.

## 2022-01-22 ENCOUNTER — Other Ambulatory Visit (HOSPITAL_BASED_OUTPATIENT_CLINIC_OR_DEPARTMENT_OTHER): Payer: Self-pay

## 2022-01-22 NOTE — Telephone Encounter (Signed)
The paper work was signed hme is going to fax it directly to [place]  ... Pt was notified

## 2022-01-31 DIAGNOSIS — M25551 Pain in right hip: Secondary | ICD-10-CM | POA: Diagnosis not present

## 2022-01-31 DIAGNOSIS — R269 Unspecified abnormalities of gait and mobility: Secondary | ICD-10-CM | POA: Diagnosis not present

## 2022-02-21 DIAGNOSIS — M25551 Pain in right hip: Secondary | ICD-10-CM | POA: Diagnosis not present

## 2022-02-21 DIAGNOSIS — R269 Unspecified abnormalities of gait and mobility: Secondary | ICD-10-CM | POA: Diagnosis not present

## 2022-02-22 DIAGNOSIS — Z6835 Body mass index (BMI) 35.0-35.9, adult: Secondary | ICD-10-CM | POA: Diagnosis not present

## 2022-02-22 DIAGNOSIS — Z0000 Encounter for general adult medical examination without abnormal findings: Secondary | ICD-10-CM | POA: Diagnosis not present

## 2022-02-22 DIAGNOSIS — Z23 Encounter for immunization: Secondary | ICD-10-CM | POA: Diagnosis not present

## 2022-02-22 DIAGNOSIS — Z125 Encounter for screening for malignant neoplasm of prostate: Secondary | ICD-10-CM | POA: Diagnosis not present

## 2022-03-05 DIAGNOSIS — H524 Presbyopia: Secondary | ICD-10-CM | POA: Diagnosis not present

## 2022-03-07 DIAGNOSIS — R269 Unspecified abnormalities of gait and mobility: Secondary | ICD-10-CM | POA: Diagnosis not present

## 2022-03-07 DIAGNOSIS — M25551 Pain in right hip: Secondary | ICD-10-CM | POA: Diagnosis not present

## 2022-03-21 DIAGNOSIS — M25551 Pain in right hip: Secondary | ICD-10-CM | POA: Diagnosis not present

## 2022-03-21 DIAGNOSIS — R269 Unspecified abnormalities of gait and mobility: Secondary | ICD-10-CM | POA: Diagnosis not present

## 2022-04-11 DIAGNOSIS — R269 Unspecified abnormalities of gait and mobility: Secondary | ICD-10-CM | POA: Diagnosis not present

## 2022-04-11 DIAGNOSIS — M25551 Pain in right hip: Secondary | ICD-10-CM | POA: Diagnosis not present

## 2022-04-25 DIAGNOSIS — R269 Unspecified abnormalities of gait and mobility: Secondary | ICD-10-CM | POA: Diagnosis not present

## 2022-04-25 DIAGNOSIS — M25551 Pain in right hip: Secondary | ICD-10-CM | POA: Diagnosis not present

## 2022-05-09 DIAGNOSIS — R269 Unspecified abnormalities of gait and mobility: Secondary | ICD-10-CM | POA: Diagnosis not present

## 2022-05-09 DIAGNOSIS — M25551 Pain in right hip: Secondary | ICD-10-CM | POA: Diagnosis not present

## 2022-05-15 DIAGNOSIS — B20 Human immunodeficiency virus [HIV] disease: Secondary | ICD-10-CM | POA: Diagnosis not present

## 2022-05-15 DIAGNOSIS — E785 Hyperlipidemia, unspecified: Secondary | ICD-10-CM | POA: Diagnosis not present

## 2022-05-15 DIAGNOSIS — I2510 Atherosclerotic heart disease of native coronary artery without angina pectoris: Secondary | ICD-10-CM | POA: Diagnosis not present

## 2022-05-15 DIAGNOSIS — I10 Essential (primary) hypertension: Secondary | ICD-10-CM | POA: Diagnosis not present

## 2022-05-23 DIAGNOSIS — M25551 Pain in right hip: Secondary | ICD-10-CM | POA: Diagnosis not present

## 2022-05-23 DIAGNOSIS — R269 Unspecified abnormalities of gait and mobility: Secondary | ICD-10-CM | POA: Diagnosis not present

## 2022-06-13 DIAGNOSIS — M25551 Pain in right hip: Secondary | ICD-10-CM | POA: Diagnosis not present

## 2022-06-13 DIAGNOSIS — R269 Unspecified abnormalities of gait and mobility: Secondary | ICD-10-CM | POA: Diagnosis not present

## 2022-07-03 DIAGNOSIS — R269 Unspecified abnormalities of gait and mobility: Secondary | ICD-10-CM | POA: Diagnosis not present

## 2022-07-03 DIAGNOSIS — M25551 Pain in right hip: Secondary | ICD-10-CM | POA: Diagnosis not present

## 2022-07-04 DIAGNOSIS — K080 Exfoliation of teeth due to systemic causes: Secondary | ICD-10-CM | POA: Diagnosis not present

## 2022-07-17 DIAGNOSIS — M25551 Pain in right hip: Secondary | ICD-10-CM | POA: Diagnosis not present

## 2022-07-17 DIAGNOSIS — R269 Unspecified abnormalities of gait and mobility: Secondary | ICD-10-CM | POA: Diagnosis not present

## 2022-07-31 DIAGNOSIS — M25551 Pain in right hip: Secondary | ICD-10-CM | POA: Diagnosis not present

## 2022-07-31 DIAGNOSIS — R269 Unspecified abnormalities of gait and mobility: Secondary | ICD-10-CM | POA: Diagnosis not present

## 2022-08-14 DIAGNOSIS — M25551 Pain in right hip: Secondary | ICD-10-CM | POA: Diagnosis not present

## 2022-08-14 DIAGNOSIS — R269 Unspecified abnormalities of gait and mobility: Secondary | ICD-10-CM | POA: Diagnosis not present

## 2022-08-14 DIAGNOSIS — C44729 Squamous cell carcinoma of skin of left lower limb, including hip: Secondary | ICD-10-CM | POA: Diagnosis not present

## 2022-08-14 DIAGNOSIS — D485 Neoplasm of uncertain behavior of skin: Secondary | ICD-10-CM | POA: Diagnosis not present

## 2022-08-28 DIAGNOSIS — M25551 Pain in right hip: Secondary | ICD-10-CM | POA: Diagnosis not present

## 2022-08-28 DIAGNOSIS — R269 Unspecified abnormalities of gait and mobility: Secondary | ICD-10-CM | POA: Diagnosis not present

## 2022-08-30 DIAGNOSIS — C44729 Squamous cell carcinoma of skin of left lower limb, including hip: Secondary | ICD-10-CM | POA: Diagnosis not present

## 2022-09-11 DIAGNOSIS — E782 Mixed hyperlipidemia: Secondary | ICD-10-CM | POA: Diagnosis not present

## 2022-09-11 DIAGNOSIS — B20 Human immunodeficiency virus [HIV] disease: Secondary | ICD-10-CM | POA: Diagnosis not present

## 2022-09-11 DIAGNOSIS — I10 Essential (primary) hypertension: Secondary | ICD-10-CM | POA: Diagnosis not present

## 2022-09-11 DIAGNOSIS — I2510 Atherosclerotic heart disease of native coronary artery without angina pectoris: Secondary | ICD-10-CM | POA: Diagnosis not present

## 2022-09-18 DIAGNOSIS — R269 Unspecified abnormalities of gait and mobility: Secondary | ICD-10-CM | POA: Diagnosis not present

## 2022-09-18 DIAGNOSIS — M25551 Pain in right hip: Secondary | ICD-10-CM | POA: Diagnosis not present

## 2022-10-02 DIAGNOSIS — M25551 Pain in right hip: Secondary | ICD-10-CM | POA: Diagnosis not present

## 2022-10-02 DIAGNOSIS — R269 Unspecified abnormalities of gait and mobility: Secondary | ICD-10-CM | POA: Diagnosis not present

## 2022-10-16 DIAGNOSIS — R269 Unspecified abnormalities of gait and mobility: Secondary | ICD-10-CM | POA: Diagnosis not present

## 2022-10-16 DIAGNOSIS — M25551 Pain in right hip: Secondary | ICD-10-CM | POA: Diagnosis not present

## 2022-10-17 ENCOUNTER — Other Ambulatory Visit (INDEPENDENT_AMBULATORY_CARE_PROVIDER_SITE_OTHER): Payer: Medicare Other

## 2022-10-17 ENCOUNTER — Other Ambulatory Visit (HOSPITAL_COMMUNITY)
Admission: RE | Admit: 2022-10-17 | Discharge: 2022-10-17 | Disposition: A | Discharge status: Home / Self Care | Payer: Medicare Other | Source: Ambulatory Visit | Attending: Internal Medicine | Admitting: Internal Medicine

## 2022-10-17 DIAGNOSIS — B20 Human immunodeficiency virus [HIV] disease: Secondary | ICD-10-CM

## 2022-10-17 DIAGNOSIS — Z113 Encounter for screening for infections with a predominantly sexual mode of transmission: Secondary | ICD-10-CM | POA: Insufficient documentation

## 2022-10-17 DIAGNOSIS — E785 Hyperlipidemia, unspecified: Secondary | ICD-10-CM | POA: Diagnosis not present

## 2022-10-18 LAB — T-HELPER CELL (CD4) - (RCID CLINIC ONLY)
CD4 % Helper T Cell: 38 % (ref 33–65)
CD4 T Cell Abs: 499 /uL (ref 400–1790)

## 2022-10-18 LAB — URINE CYTOLOGY ANCILLARY ONLY
Chlamydia: NEGATIVE
Comment: NEGATIVE
Comment: NORMAL
Neisseria Gonorrhea: NEGATIVE

## 2022-10-18 LAB — CBC
Hematocrit: 46.2 % (ref 37.5–51.0)
MCH: 36.7 pg — ABNORMAL HIGH (ref 26.6–33.0)
MCV: 103 fL — ABNORMAL HIGH (ref 79–97)

## 2022-10-18 LAB — LIPID PANEL

## 2022-10-18 LAB — RPR: RPR Ser Ql: NONREACTIVE

## 2022-10-19 LAB — CMP14 + ANION GAP
ALT: 32 IU/L (ref 0–44)
AST: 39 IU/L (ref 0–40)
Albumin: 3.7 g/dL — ABNORMAL LOW (ref 3.9–4.9)
Alkaline Phosphatase: 44 IU/L (ref 44–121)
Anion Gap: 15 mmol/L (ref 10.0–18.0)
BUN/Creatinine Ratio: 12 (ref 10–24)
BUN: 13 mg/dL (ref 8–27)
Bilirubin Total: 0.4 mg/dL (ref 0.0–1.2)
CO2: 20 mmol/L (ref 20–29)
Calcium: 8.8 mg/dL (ref 8.6–10.2)
Chloride: 106 mmol/L (ref 96–106)
Creatinine, Ser: 1.1 mg/dL (ref 0.76–1.27)
Globulin, Total: 1.7 g/dL (ref 1.5–4.5)
Glucose: 99 mg/dL (ref 70–99)
Potassium: 4.5 mmol/L (ref 3.5–5.2)
Sodium: 141 mmol/L (ref 134–144)
Total Protein: 5.4 g/dL — ABNORMAL LOW (ref 6.0–8.5)
eGFR: 73 mL/min/{1.73_m2} (ref >59)

## 2022-10-19 LAB — LIPID PANEL
Chol/HDL Ratio: 2.7 ratio (ref 0.0–5.0)
Cholesterol, Total: 171 mg/dL (ref 100–199)
Triglycerides: 60 mg/dL (ref 0–149)
VLDL Cholesterol Cal: 12 mg/dL (ref 5–40)

## 2022-10-19 LAB — CBC
Hemoglobin: 16.5 g/dL (ref 13.0–17.7)
MCHC: 35.7 g/dL (ref 31.5–35.7)
Platelets: 288 10*3/uL (ref 150–450)
RBC: 4.5 x10E6/uL (ref 4.14–5.80)
RDW: 12.3 % (ref 11.6–15.4)
WBC: 5.6 10*3/uL (ref 3.4–10.8)

## 2022-10-19 LAB — HIV-1 RNA QUANT-NO REFLEX-BLD
HIV-1 RNA Viral Load Log: 1.954 log10copy/mL
HIV-1 RNA Viral Load: 90 copies/mL

## 2022-10-24 ENCOUNTER — Encounter: Payer: Self-pay | Admitting: Infectious Diseases

## 2022-10-24 ENCOUNTER — Ambulatory Visit: Payer: Medicare Other | Admitting: Infectious Diseases

## 2022-10-24 VITALS — BP 117/54 | HR 71 | Temp 98.1°F | Ht 68.0 in | Wt 224.0 lb

## 2022-10-24 DIAGNOSIS — B20 Human immunodeficiency virus [HIV] disease: Secondary | ICD-10-CM

## 2022-10-24 DIAGNOSIS — B009 Herpesviral infection, unspecified: Secondary | ICD-10-CM

## 2022-10-24 DIAGNOSIS — I890 Lymphedema, not elsewhere classified: Secondary | ICD-10-CM | POA: Diagnosis not present

## 2022-10-24 DIAGNOSIS — Z6830 Body mass index (BMI) 30.0-30.9, adult: Secondary | ICD-10-CM

## 2022-10-24 DIAGNOSIS — E669 Obesity, unspecified: Secondary | ICD-10-CM

## 2022-10-24 DIAGNOSIS — E785 Hyperlipidemia, unspecified: Secondary | ICD-10-CM

## 2022-10-24 DIAGNOSIS — Z113 Encounter for screening for infections with a predominantly sexual mode of transmission: Secondary | ICD-10-CM

## 2022-10-24 MED ORDER — EFAVIRENZ-EMTRICITAB-TENOFO DF 600-200-300 MG PO TABS
1.0000 | ORAL_TABLET | Freq: Every day | ORAL | 4 refills | Status: DC
Start: 2022-10-24 — End: 2023-12-02

## 2022-10-24 MED ORDER — VALACYCLOVIR HCL 1 G PO TABS
ORAL_TABLET | ORAL | 5 refills | Status: AC
Start: 2022-10-24 — End: ?

## 2022-10-24 NOTE — Assessment & Plan Note (Signed)
Has improved somewhat with wt loss.

## 2022-10-24 NOTE — Assessment & Plan Note (Signed)
Valtrex refilled.

## 2022-10-24 NOTE — Progress Notes (Signed)
   Subjective:    Patient ID: Riley Cortez, male  DOB: 11-20-1955, 67 y.o.        MRN: [id number]   HPI 67 yo M with hx of HIV+, hyperlipidemia, and HTN.  Is on atripla (via assistance fund), lipitor.   Prev TIA Sept 2013.  Prev basal cell CA of R leg excised. repeat excision 09-2022. Has regular derm f/u 12-2022.  Had kidney "boulder" removed 2020 (litho, stent, removal).    Traveling as usual, [place], [place], [place].   Has been doing well with his atripla. Some issues with freq refills (monthly).    HIV 1 RNA Quant (Copies/mL)  Date Value  10/12/2021 Not Detected  11/22/2020 Not Detected  12/09/2019 25 (H)   HIV-1 RNA Viral Load (copies/mL)  Date Value  10/17/2022 90   CD4 T Cell Abs (/uL)  Date Value  10/17/2022 499  10/12/2021 614  11/22/2020 585     Health Maintenance  Topic Date Due  . Medicare Annual Wellness (AWV)  Never done  . COVID-19 Vaccine (5 - 2023-24 season) 12/22/2021  . Pneumonia Vaccine 69+ Years old (3 of 3 - PPSV23 or PCV20) 10/15/2022  . INFLUENZA VACCINE  11/22/2022  . Colonoscopy  03/07/2025  . DTaP/Tdap/Td (3 - Td or Tdap) 10/13/2031  . Hepatitis C Screening  Completed  . Zoster Vaccines- Shingrix  Completed  . HPV VACCINES  Aged Out   Has lost 35# intentionally, feels better.   Review of Systems  Constitutional:  Negative for chills, fever and weight loss.  Respiratory:  Negative for cough and shortness of breath.   Cardiovascular:  Positive for leg swelling.  Gastrointestinal:  Negative for constipation and diarrhea.  Genitourinary:  Negative for dysuria.  All other systems reviewed and are negative.  Please see HPI. All other systems reviewed and negative.     Objective:  Physical Exam Vitals reviewed.  Constitutional:      Appearance: Normal appearance. He is obese.  HENT:     Mouth/Throat:     Mouth: Mucous membranes are moist.     Pharynx: No oropharyngeal exudate.  Eyes:     Extraocular Movements: Extraocular movements intact.      Pupils: Pupils are equal, round, and reactive to light.  Cardiovascular:     Rate and Rhythm: Normal rate and regular rhythm.  Pulmonary:     Effort: Pulmonary effort is normal.     Breath sounds: Normal breath sounds.  Musculoskeletal:     Right lower leg: Edema present.     Left lower leg: Edema present.  Neurological:     General: No focal deficit present.     Mental Status: He is alert.  Psychiatric:        Mood and Affect: Mood normal.          Assessment & Plan:

## 2022-10-24 NOTE — Assessment & Plan Note (Signed)
He is doing well Has lost 50+ pounds. Encouraged pt.

## 2022-10-24 NOTE — Assessment & Plan Note (Signed)
We reviewed his lipids, doing well on statin Normal LFTs

## 2022-10-24 NOTE — Assessment & Plan Note (Signed)
He is doing well on atripla Have asked pharm to help with med assistance.  Wt down Vax up to date.  Will get flu and covid in fall when updates come out.  Rtc in 1 year.

## 2022-10-30 DIAGNOSIS — M25551 Pain in right hip: Secondary | ICD-10-CM | POA: Diagnosis not present

## 2022-10-30 DIAGNOSIS — R269 Unspecified abnormalities of gait and mobility: Secondary | ICD-10-CM | POA: Diagnosis not present

## 2022-11-13 DIAGNOSIS — M25551 Pain in right hip: Secondary | ICD-10-CM | POA: Diagnosis not present

## 2022-11-13 DIAGNOSIS — R269 Unspecified abnormalities of gait and mobility: Secondary | ICD-10-CM | POA: Diagnosis not present

## 2022-11-27 DIAGNOSIS — R269 Unspecified abnormalities of gait and mobility: Secondary | ICD-10-CM | POA: Diagnosis not present

## 2022-11-27 DIAGNOSIS — M25551 Pain in right hip: Secondary | ICD-10-CM | POA: Diagnosis not present

## 2022-12-11 ENCOUNTER — Other Ambulatory Visit (HOSPITAL_COMMUNITY): Payer: Self-pay

## 2022-12-11 ENCOUNTER — Telehealth: Payer: Self-pay

## 2022-12-11 DIAGNOSIS — R269 Unspecified abnormalities of gait and mobility: Secondary | ICD-10-CM | POA: Diagnosis not present

## 2022-12-11 DIAGNOSIS — M25551 Pain in right hip: Secondary | ICD-10-CM | POA: Diagnosis not present

## 2022-12-11 NOTE — Telephone Encounter (Signed)
Submitted PAF grant renewal for patients Atripla medication.  Grant approved 12/11/22 - 12/11/23  Balance $5000

## 2022-12-25 DIAGNOSIS — L820 Inflamed seborrheic keratosis: Secondary | ICD-10-CM | POA: Diagnosis not present

## 2022-12-25 DIAGNOSIS — D485 Neoplasm of uncertain behavior of skin: Secondary | ICD-10-CM | POA: Diagnosis not present

## 2022-12-25 DIAGNOSIS — R269 Unspecified abnormalities of gait and mobility: Secondary | ICD-10-CM | POA: Diagnosis not present

## 2022-12-25 DIAGNOSIS — L570 Actinic keratosis: Secondary | ICD-10-CM | POA: Diagnosis not present

## 2022-12-25 DIAGNOSIS — Z85828 Personal history of other malignant neoplasm of skin: Secondary | ICD-10-CM | POA: Diagnosis not present

## 2022-12-25 DIAGNOSIS — M25551 Pain in right hip: Secondary | ICD-10-CM | POA: Diagnosis not present

## 2022-12-25 DIAGNOSIS — L821 Other seborrheic keratosis: Secondary | ICD-10-CM | POA: Diagnosis not present

## 2022-12-25 DIAGNOSIS — L814 Other melanin hyperpigmentation: Secondary | ICD-10-CM | POA: Diagnosis not present

## 2022-12-25 DIAGNOSIS — D045 Carcinoma in situ of skin of trunk: Secondary | ICD-10-CM | POA: Diagnosis not present

## 2022-12-25 DIAGNOSIS — D225 Melanocytic nevi of trunk: Secondary | ICD-10-CM | POA: Diagnosis not present

## 2023-01-08 DIAGNOSIS — M25551 Pain in right hip: Secondary | ICD-10-CM | POA: Diagnosis not present

## 2023-01-08 DIAGNOSIS — R269 Unspecified abnormalities of gait and mobility: Secondary | ICD-10-CM | POA: Diagnosis not present

## 2023-01-09 DIAGNOSIS — K080 Exfoliation of teeth due to systemic causes: Secondary | ICD-10-CM | POA: Diagnosis not present

## 2023-01-10 DIAGNOSIS — L905 Scar conditions and fibrosis of skin: Secondary | ICD-10-CM | POA: Diagnosis not present

## 2023-01-10 DIAGNOSIS — D045 Carcinoma in situ of skin of trunk: Secondary | ICD-10-CM | POA: Diagnosis not present

## 2023-01-15 DIAGNOSIS — I872 Venous insufficiency (chronic) (peripheral): Secondary | ICD-10-CM | POA: Diagnosis not present

## 2023-01-15 DIAGNOSIS — I2510 Atherosclerotic heart disease of native coronary artery without angina pectoris: Secondary | ICD-10-CM | POA: Diagnosis not present

## 2023-01-15 DIAGNOSIS — I10 Essential (primary) hypertension: Secondary | ICD-10-CM | POA: Diagnosis not present

## 2023-01-15 DIAGNOSIS — E782 Mixed hyperlipidemia: Secondary | ICD-10-CM | POA: Diagnosis not present

## 2023-01-22 DIAGNOSIS — R269 Unspecified abnormalities of gait and mobility: Secondary | ICD-10-CM | POA: Diagnosis not present

## 2023-01-22 DIAGNOSIS — M25551 Pain in right hip: Secondary | ICD-10-CM | POA: Diagnosis not present

## 2023-02-07 DIAGNOSIS — R269 Unspecified abnormalities of gait and mobility: Secondary | ICD-10-CM | POA: Diagnosis not present

## 2023-02-07 DIAGNOSIS — M25551 Pain in right hip: Secondary | ICD-10-CM | POA: Diagnosis not present

## 2023-02-27 DIAGNOSIS — Z0000 Encounter for general adult medical examination without abnormal findings: Secondary | ICD-10-CM | POA: Diagnosis not present

## 2023-02-27 DIAGNOSIS — B20 Human immunodeficiency virus [HIV] disease: Secondary | ICD-10-CM | POA: Diagnosis not present

## 2023-02-27 DIAGNOSIS — I2510 Atherosclerotic heart disease of native coronary artery without angina pectoris: Secondary | ICD-10-CM | POA: Diagnosis not present

## 2023-02-27 DIAGNOSIS — Z23 Encounter for immunization: Secondary | ICD-10-CM | POA: Diagnosis not present

## 2023-02-27 DIAGNOSIS — E785 Hyperlipidemia, unspecified: Secondary | ICD-10-CM | POA: Diagnosis not present

## 2023-02-27 DIAGNOSIS — Z125 Encounter for screening for malignant neoplasm of prostate: Secondary | ICD-10-CM | POA: Diagnosis not present

## 2023-03-05 DIAGNOSIS — R269 Unspecified abnormalities of gait and mobility: Secondary | ICD-10-CM | POA: Diagnosis not present

## 2023-03-05 DIAGNOSIS — M25551 Pain in right hip: Secondary | ICD-10-CM | POA: Diagnosis not present

## 2023-03-14 DIAGNOSIS — H524 Presbyopia: Secondary | ICD-10-CM | POA: Diagnosis not present

## 2023-03-19 DIAGNOSIS — R269 Unspecified abnormalities of gait and mobility: Secondary | ICD-10-CM | POA: Diagnosis not present

## 2023-03-19 DIAGNOSIS — M25551 Pain in right hip: Secondary | ICD-10-CM | POA: Diagnosis not present

## 2023-04-09 DIAGNOSIS — R269 Unspecified abnormalities of gait and mobility: Secondary | ICD-10-CM | POA: Diagnosis not present

## 2023-04-09 DIAGNOSIS — M25551 Pain in right hip: Secondary | ICD-10-CM | POA: Diagnosis not present

## 2023-04-30 DIAGNOSIS — M25551 Pain in right hip: Secondary | ICD-10-CM | POA: Diagnosis not present

## 2023-04-30 DIAGNOSIS — R269 Unspecified abnormalities of gait and mobility: Secondary | ICD-10-CM | POA: Diagnosis not present

## 2023-05-21 DIAGNOSIS — R269 Unspecified abnormalities of gait and mobility: Secondary | ICD-10-CM | POA: Diagnosis not present

## 2023-05-21 DIAGNOSIS — M25551 Pain in right hip: Secondary | ICD-10-CM | POA: Diagnosis not present

## 2023-06-04 DIAGNOSIS — R269 Unspecified abnormalities of gait and mobility: Secondary | ICD-10-CM | POA: Diagnosis not present

## 2023-06-04 DIAGNOSIS — M25551 Pain in right hip: Secondary | ICD-10-CM | POA: Diagnosis not present

## 2023-06-18 DIAGNOSIS — M25551 Pain in right hip: Secondary | ICD-10-CM | POA: Diagnosis not present

## 2023-06-18 DIAGNOSIS — R269 Unspecified abnormalities of gait and mobility: Secondary | ICD-10-CM | POA: Diagnosis not present

## 2023-07-16 DIAGNOSIS — M25551 Pain in right hip: Secondary | ICD-10-CM | POA: Diagnosis not present

## 2023-07-16 DIAGNOSIS — K080 Exfoliation of teeth due to systemic causes: Secondary | ICD-10-CM | POA: Diagnosis not present

## 2023-07-16 DIAGNOSIS — R269 Unspecified abnormalities of gait and mobility: Secondary | ICD-10-CM | POA: Diagnosis not present

## 2023-07-30 DIAGNOSIS — R269 Unspecified abnormalities of gait and mobility: Secondary | ICD-10-CM | POA: Diagnosis not present

## 2023-07-30 DIAGNOSIS — M25551 Pain in right hip: Secondary | ICD-10-CM | POA: Diagnosis not present

## 2023-08-13 DIAGNOSIS — M25551 Pain in right hip: Secondary | ICD-10-CM | POA: Diagnosis not present

## 2023-08-13 DIAGNOSIS — R269 Unspecified abnormalities of gait and mobility: Secondary | ICD-10-CM | POA: Diagnosis not present

## 2023-08-27 DIAGNOSIS — M25551 Pain in right hip: Secondary | ICD-10-CM | POA: Diagnosis not present

## 2023-08-27 DIAGNOSIS — R269 Unspecified abnormalities of gait and mobility: Secondary | ICD-10-CM | POA: Diagnosis not present

## 2023-09-03 DIAGNOSIS — E785 Hyperlipidemia, unspecified: Secondary | ICD-10-CM | POA: Diagnosis not present

## 2023-09-03 DIAGNOSIS — I10 Essential (primary) hypertension: Secondary | ICD-10-CM | POA: Diagnosis not present

## 2023-09-03 DIAGNOSIS — I2510 Atherosclerotic heart disease of native coronary artery without angina pectoris: Secondary | ICD-10-CM | POA: Diagnosis not present

## 2023-09-17 DIAGNOSIS — I10 Essential (primary) hypertension: Secondary | ICD-10-CM | POA: Diagnosis not present

## 2023-09-17 DIAGNOSIS — M25551 Pain in right hip: Secondary | ICD-10-CM | POA: Diagnosis not present

## 2023-09-17 DIAGNOSIS — I872 Venous insufficiency (chronic) (peripheral): Secondary | ICD-10-CM | POA: Diagnosis not present

## 2023-09-17 DIAGNOSIS — R269 Unspecified abnormalities of gait and mobility: Secondary | ICD-10-CM | POA: Diagnosis not present

## 2023-09-17 DIAGNOSIS — I2510 Atherosclerotic heart disease of native coronary artery without angina pectoris: Secondary | ICD-10-CM | POA: Diagnosis not present

## 2023-09-17 DIAGNOSIS — E782 Mixed hyperlipidemia: Secondary | ICD-10-CM | POA: Diagnosis not present

## 2023-10-01 DIAGNOSIS — R269 Unspecified abnormalities of gait and mobility: Secondary | ICD-10-CM | POA: Diagnosis not present

## 2023-10-01 DIAGNOSIS — M25551 Pain in right hip: Secondary | ICD-10-CM | POA: Diagnosis not present

## 2023-10-15 DIAGNOSIS — M25551 Pain in right hip: Secondary | ICD-10-CM | POA: Diagnosis not present

## 2023-10-15 DIAGNOSIS — R269 Unspecified abnormalities of gait and mobility: Secondary | ICD-10-CM | POA: Diagnosis not present

## 2023-11-11 ENCOUNTER — Telehealth: Payer: Self-pay | Admitting: Infectious Diseases

## 2023-11-11 NOTE — Telephone Encounter (Unsigned)
 Copied from CRM [id number]. Topic: Appointments - Appointment Scheduling >> Nov 11, 2023  3:02 PM Cherylann RAMAN wrote: Patient is requesting an appt with Dr. Eben. Patient states that it has been a year since he has been to see the provider and is needing his medication. Please contact patient back at [phone]

## 2023-11-12 ENCOUNTER — Other Ambulatory Visit: Payer: Self-pay

## 2023-11-12 ENCOUNTER — Telehealth: Payer: Self-pay | Admitting: Infectious Diseases

## 2023-11-12 DIAGNOSIS — R269 Unspecified abnormalities of gait and mobility: Secondary | ICD-10-CM | POA: Diagnosis not present

## 2023-11-12 DIAGNOSIS — M25551 Pain in right hip: Secondary | ICD-10-CM | POA: Diagnosis not present

## 2023-11-12 DIAGNOSIS — B20 Human immunodeficiency virus [HIV] disease: Secondary | ICD-10-CM

## 2023-11-12 NOTE — Telephone Encounter (Signed)
 Attempted to contact patient via telephone to schedule future appointment. Phone rang once, no answer and voicemail set up.  Will make future appointments and mail them to the patient.      Copied from CRM [id number]. Topic: Appointments - Scheduling Inquiry for Clinic >> Nov 07, 2023 11:33 AM Carmell SAUNDERS wrote: Reason for CRM: Pt looking to schedule an annual checkup with blood work and med check with Dr. Eben, but no schedule showing. Please contact pt at [phone].

## 2023-11-12 NOTE — Telephone Encounter (Signed)
 I called pt back to sch his appt with Dr. Eben, but no answer. Unable to leave message, vm is not setup.

## 2023-11-14 MED ORDER — EFAVIRENZ-EMTRICITAB-TENOFO DF 600-200-300 MG PO TABS: 1.0000 | ORAL_TABLET | Freq: Every day | ORAL | 4 refills | Status: DC

## 2023-11-18 DIAGNOSIS — B20 Human immunodeficiency virus [HIV] disease: Secondary | ICD-10-CM | POA: Diagnosis not present

## 2023-11-25 ENCOUNTER — Other Ambulatory Visit: Payer: Self-pay | Admitting: Infectious Diseases

## 2023-11-25 DIAGNOSIS — Z113 Encounter for screening for infections with a predominantly sexual mode of transmission: Secondary | ICD-10-CM

## 2023-11-25 DIAGNOSIS — B20 Human immunodeficiency virus [HIV] disease: Secondary | ICD-10-CM

## 2023-11-25 DIAGNOSIS — Z79899 Other long term (current) drug therapy: Secondary | ICD-10-CM

## 2023-11-26 ENCOUNTER — Other Ambulatory Visit (HOSPITAL_COMMUNITY)
Admission: RE | Admit: 2023-11-26 | Discharge: 2023-11-26 | Disposition: A | Discharge status: Home / Self Care | Source: Ambulatory Visit | Attending: Internal Medicine | Admitting: Internal Medicine

## 2023-11-26 ENCOUNTER — Other Ambulatory Visit

## 2023-11-26 DIAGNOSIS — Z113 Encounter for screening for infections with a predominantly sexual mode of transmission: Secondary | ICD-10-CM | POA: Diagnosis not present

## 2023-11-26 DIAGNOSIS — B20 Human immunodeficiency virus [HIV] disease: Secondary | ICD-10-CM | POA: Diagnosis not present

## 2023-11-26 DIAGNOSIS — Z79899 Other long term (current) drug therapy: Secondary | ICD-10-CM

## 2023-11-27 LAB — COMPREHENSIVE METABOLIC PANEL WITH GFR
ALT: 35 IU/L (ref 0–44)
AST: 39 IU/L (ref 0–40)
Albumin: 3.5 g/dL — ABNORMAL LOW (ref 3.9–4.9)
Alkaline Phosphatase: 52 IU/L (ref 44–121)
BUN/Creatinine Ratio: 10 (ref 10–24)
BUN: 12 mg/dL (ref 8–27)
Bilirubin Total: 0.5 mg/dL (ref 0.0–1.2)
CO2: 21 mmol/L (ref 20–29)
Calcium: 9 mg/dL (ref 8.6–10.2)
Chloride: 102 mmol/L (ref 96–106)
Creatinine, Ser: 1.21 mg/dL (ref 0.76–1.27)
Globulin, Total: 2 g/dL (ref 1.5–4.5)
Glucose: 106 mg/dL — ABNORMAL HIGH (ref 70–99)
Potassium: 4.2 mmol/L (ref 3.5–5.2)
Sodium: 138 mmol/L (ref 134–144)
Total Protein: 5.5 g/dL — ABNORMAL LOW (ref 6.0–8.5)
eGFR: 64 mL/min/1.73 (ref >59)

## 2023-11-27 LAB — CBC
Hematocrit: 41.9 % (ref 37.5–51.0)
Hemoglobin: 14.9 g/dL (ref 13.0–17.7)
MCH: 37.7 pg — ABNORMAL HIGH (ref 26.6–33.0)
MCHC: 35.6 g/dL (ref 31.5–35.7)
MCV: 106 fL — ABNORMAL HIGH (ref 79–97)
Platelets: 308 x10E3/uL (ref 150–450)
RBC: 3.95 x10E6/uL — ABNORMAL LOW (ref 4.14–5.80)
RDW: 11.9 % (ref 11.6–15.4)
WBC: 6.3 x10E3/uL (ref 3.4–10.8)

## 2023-11-27 LAB — LIPID PANEL
Chol/HDL Ratio: 2.8 ratio (ref 0.0–5.0)
Cholesterol, Total: 165 mg/dL (ref 100–199)
HDL: 59 mg/dL (ref >39)
LDL Chol Calc (NIH): 92 mg/dL (ref 0–99)
Triglycerides: 76 mg/dL (ref 0–149)
VLDL Cholesterol Cal: 14 mg/dL (ref 5–40)

## 2023-11-27 LAB — URINE CYTOLOGY ANCILLARY ONLY
Chlamydia: NEGATIVE
Comment: NEGATIVE
Comment: NORMAL
Neisseria Gonorrhea: NEGATIVE

## 2023-11-27 LAB — HIV-1 RNA QUANT-NO REFLEX-BLD: HIV-1 RNA Viral Load: <20 {copies}/mL

## 2023-11-27 LAB — RPR: RPR Ser Ql: NONREACTIVE

## 2023-11-27 LAB — T-HELPER CELLS (CD4) COUNT (NOT AT ARMC)
CD4 % Helper T Cell: 38 % (ref 33–65)
CD4 T Cell Abs: 501 /uL (ref 400–1790)

## 2023-11-28 ENCOUNTER — Telehealth: Payer: Self-pay | Admitting: Infectious Diseases

## 2023-11-28 NOTE — Telephone Encounter (Unsigned)
 Copied from CRM [id number]. Topic: Clinical - Medication Refill >> Nov 28, 2023  3:47 PM Carrielelia G wrote: Medication: efavirenz -emtricitab-tenofovir  (ATRIPLA) 600-200-300 MG tablet [1  Has the patient contacted their pharmacy? Yes (Agent: If no, request that the patient contact the pharmacy for the refill. If patient does not wish to contact the pharmacy document the reason why and proceed with request.) (Agent: If yes, when and what did the pharmacy advise?)  This is the patient's preferred pharmacy:  [facility] [place]  [place], [place] - [address] [place] [address] Phone: [phone] Fax: [phone]   Is this the correct pharmacy for this prescription? Yes If no, delete pharmacy and type the correct one.    Is the patient out of the medication? No  Has the patient been seen for an appointment in the last year OR does the patient have an upcoming appointment? Yes  Can we respond through MyChart? Yes  Agent: Please be advised that Rx refills may take up to 3 business days. We ask that you follow-up with your pharmacy.

## 2023-11-28 NOTE — Telephone Encounter (Signed)
 Per chart review, prescription for efavirenz -emtricitab-tenofovir  (ATRIPLA) 600-200-300 MG tablet that started on 10/24/2023. There are 4 refills available with that prescription. This prescription is located at the pharmacy on file.

## 2023-11-29 ENCOUNTER — Other Ambulatory Visit: Payer: Self-pay | Admitting: Infectious Diseases

## 2023-11-29 DIAGNOSIS — B20 Human immunodeficiency virus [HIV] disease: Secondary | ICD-10-CM

## 2023-12-03 DIAGNOSIS — M25551 Pain in right hip: Secondary | ICD-10-CM | POA: Diagnosis not present

## 2023-12-03 DIAGNOSIS — R269 Unspecified abnormalities of gait and mobility: Secondary | ICD-10-CM | POA: Diagnosis not present

## 2023-12-10 ENCOUNTER — Encounter: Payer: Self-pay | Admitting: Infectious Diseases

## 2023-12-10 ENCOUNTER — Ambulatory Visit: Admitting: Infectious Diseases

## 2023-12-10 VITALS — BP 110/71 | HR 76 | Temp 98.4°F | Ht 68.0 in | Wt 240.4 lb

## 2023-12-10 DIAGNOSIS — E785 Hyperlipidemia, unspecified: Secondary | ICD-10-CM

## 2023-12-10 DIAGNOSIS — B20 Human immunodeficiency virus [HIV] disease: Secondary | ICD-10-CM | POA: Diagnosis not present

## 2023-12-10 DIAGNOSIS — I259 Chronic ischemic heart disease, unspecified: Secondary | ICD-10-CM

## 2023-12-10 DIAGNOSIS — H3090 Unspecified chorioretinal inflammation, unspecified eye: Secondary | ICD-10-CM | POA: Diagnosis not present

## 2023-12-10 DIAGNOSIS — E669 Obesity, unspecified: Secondary | ICD-10-CM

## 2023-12-10 DIAGNOSIS — R600 Localized edema: Secondary | ICD-10-CM

## 2023-12-10 DIAGNOSIS — Z6836 Body mass index (BMI) 36.0-36.9, adult: Secondary | ICD-10-CM

## 2023-12-10 DIAGNOSIS — I890 Lymphedema, not elsewhere classified: Secondary | ICD-10-CM

## 2023-12-10 DIAGNOSIS — Z113 Encounter for screening for infections with a predominantly sexual mode of transmission: Secondary | ICD-10-CM

## 2023-12-10 MED ORDER — EFAVIRENZ-EMTRICITAB-TENOFO DF 600-200-300 MG PO TABS: 1.0000 | ORAL_TABLET | Freq: Every day | ORAL | 6 refills | Status: AC

## 2023-12-10 NOTE — Progress Notes (Signed)
   Subjective:    Patient ID: Riley Cortez, male  DOB: [birth year], 68 y.o.        MRN: [id number]   HPI 68 yo M with hx of HIV+, hyperlipidemia, and HTN.  Is on atripla (via assistance fund), lipitor.   Prev TIA Sept 2013.  Prev basal cell CA of R leg excised. repeat excision 09-2022. Has regular derm f/u 12-2023.  Had kidney boulder removed 2020 (litho, stent, removal).     Traveling as usual, [place] ([address], [address]  [address]), [place], [place], [place] ([place]), [place].    Has been doing well with his atripla. Some issues with freq refills (monthly).  Has been having more LE swelling, on diuretic, given compression socks. Was seen by [facility].    HIV 1 RNA Quant (Copies/mL)  Date Value  10/12/2021 Not Detected  11/22/2020 Not Detected  12/09/2019 25 (H)   HIV-1 RNA Viral Load (copies/mL)  Date Value  11/26/2023 <20  10/17/2022 90   CD4 T Cell Abs (/uL)  Date Value  11/26/2023 501  10/17/2022 499  10/12/2021 614     Health Maintenance  Topic Date Due  . COVID-19 Vaccine (5 - 2024-25 season) 12/23/2022  . INFLUENZA VACCINE  11/22/2023  . Medicare Annual Wellness (AWV)  02/27/2024  . Colonoscopy  03/07/2025  . DTaP/Tdap/Td (3 - Td or Tdap) 10/13/2031  . Pneumococcal Vaccine: 50+ Years  Completed  . Hepatitis C Screening  Completed  . Zoster Vaccines- Shingrix   Completed  . HPV VACCINES  Aged Out  . Meningococcal B Vaccine  Aged Out  . Hepatitis B Vaccines 19-59 Average Risk  Discontinued      Review of Systems  Constitutional:  Negative for chills, fever and weight loss.  Respiratory:  Negative for cough and shortness of breath.   Cardiovascular:  Positive for leg swelling. Negative for chest pain.  Gastrointestinal:  Negative for constipation and diarrhea.  Genitourinary:  Negative for dysuria.  Neurological:  Negative for headaches.    Please see HPI. All other systems reviewed and negative.     Objective:  Physical Exam Vitals reviewed.  Constitutional:       Appearance: Normal appearance. He is obese.  HENT:     Mouth/Throat:     Mouth: Mucous membranes are moist.     Pharynx: No oropharyngeal exudate.  Eyes:     Extraocular Movements: Extraocular movements intact.     Pupils: Pupils are equal, round, and reactive to light.  Cardiovascular:     Rate and Rhythm: Normal rate and regular rhythm.  Pulmonary:     Effort: Pulmonary effort is normal.     Breath sounds: Normal breath sounds.  Abdominal:     General: Bowel sounds are normal. There is no distension.     Palpations: Abdomen is soft.     Tenderness: There is no abdominal tenderness.  Musculoskeletal:        General: Normal range of motion.     Cervical back: Normal range of motion and neck supple.     Right lower leg: No edema.     Left lower leg: No edema.  Neurological:     General: No focal deficit present.     Mental Status: He is alert.  Psychiatric:        Mood and Affect: Mood normal.           Assessment & Plan:

## 2023-12-10 NOTE — Assessment & Plan Note (Signed)
 Has had issues getting his meds, appts.  Gave him my cell # Encourage wt loss, edema f/u.  Has CV f/u for his recent fall.  Will see him back in 1 year, labs prior.

## 2023-12-10 NOTE — Assessment & Plan Note (Signed)
 We reviewed his wt, encouraged wt loss.

## 2023-12-10 NOTE — Assessment & Plan Note (Signed)
Well controlled on lipitor. 

## 2023-12-10 NOTE — Assessment & Plan Note (Signed)
 Encouraged him to wear compression stockings, keep legs elevated, wt loss.

## 2023-12-10 NOTE — Assessment & Plan Note (Signed)
 Has optho f/u pending.

## 2023-12-16 ENCOUNTER — Telehealth: Payer: Self-pay

## 2023-12-16 ENCOUNTER — Other Ambulatory Visit (HOSPITAL_COMMUNITY): Payer: Self-pay

## 2023-12-16 NOTE — Telephone Encounter (Signed)
 Submitted PAF grant renewal for patients Atripla medication.   Grant approved 12/12/2023 - 12/15/2024   Balance $5000

## 2023-12-24 ENCOUNTER — Ambulatory Visit: Attending: Infectious Diseases

## 2023-12-24 ENCOUNTER — Other Ambulatory Visit: Payer: Self-pay

## 2023-12-24 DIAGNOSIS — R2689 Other abnormalities of gait and mobility: Secondary | ICD-10-CM | POA: Diagnosis not present

## 2023-12-24 DIAGNOSIS — I259 Chronic ischemic heart disease, unspecified: Secondary | ICD-10-CM | POA: Insufficient documentation

## 2023-12-24 DIAGNOSIS — M6281 Muscle weakness (generalized): Secondary | ICD-10-CM | POA: Insufficient documentation

## 2023-12-24 DIAGNOSIS — M5459 Other low back pain: Secondary | ICD-10-CM | POA: Diagnosis not present

## 2023-12-24 DIAGNOSIS — R600 Localized edema: Secondary | ICD-10-CM | POA: Diagnosis not present

## 2023-12-24 NOTE — Therapy (Signed)
 OUTPATIENT PHYSICAL THERAPY THORACOLUMBAR EVALUATION   Patient Name: Riley Cortez MRN: [id number] DOB:Aug 16, 1955, 68 y.o., male Today's Date: 12/24/2023  END OF SESSION:  PT End of Session - 12/24/23 1257     Visit Number 1    Number of Visits 12    Date for PT Re-Evaluation 02/23/24    Authorization Type BCBS    PT Start Time 1215    PT Stop Time 1300    PT Time Calculation (min) 45 min    Activity Tolerance Patient tolerated treatment well    Behavior During Therapy WFL for tasks assessed/performed          Past Medical History:  Diagnosis Date   Cancer (HCC)    skin   HIV infection (HCC)    Hyperlipidemia    Myocardial infarct [facility])    Past Surgical History:  Procedure Laterality Date   NASAL SEPTUM SURGERY     PTCA     skin cancer removal     Patient Active Problem List   Diagnosis Date Noted   COVID-19 04/07/2020   Nephrolithiasis 12/22/2019   Lymphedema of lower extremity 11/11/2018   Edema 10/14/2017   Obesity (BMI 30-39.9) 12/30/2013   TIA (transient ischemic attack) 01/22/2012   DISEASE, DISSEMINATED, D/T MYCOBACTERIA 06/14/2006   Herpes simplex virus (HSV) infection 06/14/2006   Human immunodeficiency virus (HIV) disease (HCC) 01/31/2006   Hyperlipidemia 01/31/2006   Chorioretinitis 01/31/2006   MYOCARDIAL INFARCTION, HX OF 05/10/2001   CORONARY ARTERY DISEASE, S/P PTCA 05/10/2001    PCP: Loretha Richerd SAUNDERS, MD   REFERRING PROVIDER: Eben Reyes BROCKS, MD  REFERRING DIAG: R60.0 (ICD-10-CM) - Localized edema I25.9 (ICD-10-CM) - Chronic ischemic heart disease, unspecified  Rationale for Evaluation and Treatment: Rehabilitation  THERAPY DIAG:  Other low back pain  Muscle weakness (generalized)  Other abnormalities of gait and mobility  ONSET DATE: 5 years  SUBJECTIVE:                                                                                                                                                                                            SUBJECTIVE STATEMENT: Patient arrives to OPPT with c/o low back pain ongoing over 5 years.  Denies LE symptoms or weakness  PERTINENT HISTORY:  68 yo M with hx of HIV+, hyperlipidemia, and HTN.  Is on atripla (via assistance fund), lipitor.   Prev TIA Sept 2013.  Prev basal cell CA of R leg excised. repeat excision 09-2022. Has regular derm f/u 12-2023.  Had kidney boulder removed 2020 (litho, stent, removal).     Traveling as usual, [place] ([address], [place]), [place], [place],  [place]), [place].    Has been doing well with his atripla. Some issues with freq refills (monthly).  Has been having more LE swelling, on diuretic, given compression socks. Was seen by [facility].   PAIN:  Are you having pain? Yes: NPRS scale: 1-7/10 Pain location: low back Pain description: ache Aggravating factors: standing, mowing and prolonged walking Relieving factors: sitting  PRECAUTIONS: Other: HIV  RED FLAGS: None   WEIGHT BEARING RESTRICTIONS: No  FALLS:  Has patient fallen in last 6 months? Yes. Number of falls 1due to low BP   OCCUPATION: retired  PLOF: Independent  PATIENT GOALS: To manage my low back   NEXT MD VISIT: TBD  OBJECTIVE:  Note: Objective measures were completed at Evaluation unless otherwise noted.  DIAGNOSTIC FINDINGS:  none  PATIENT SURVEYS:  Modified Oswestry:   Interpretation of scores: Score Category Description  0-20% Minimal Disability The patient can cope with most living activities. Usually no treatment is indicated apart from advice on lifting, sitting and exercise  21-40% Moderate Disability The patient experiences more pain and difficulty with sitting, lifting and standing. Travel and social life are more difficult and they may be disabled from work. Personal care, sexual activity and sleeping are not grossly affected, and the patient can usually be managed by conservative means  41-60% Severe Disability Pain remains the main problem  in this group, but activities of daily living are affected. These patients require a detailed investigation  61-80% Crippled Back pain impinges on all aspects of the patient's life. Positive intervention is required  81-100% Bed-bound  These patients are either bed-bound or exaggerating their symptoms  Bluford FORBES Zoe DELENA Karon DELENA, et al. Surgery versus conservative management of stable thoracolumbar fracture: the PRESTO feasibility RCT. [place] [employer]; 2021 Nov. [facility] Technology Assessment, No. 25.62.) Appendix 3, Oswestry Disability Index category descriptors. Available from: FindJewel[employer].cz  Minimally Clinically Important Difference (MCID) = 12.8%  MUSCLE LENGTH: Hamstrings: Right 70 deg; Left 70 deg PKB negative B  POSTURE: decreased lumbar lordosis  PALPATION: deferred  LUMBAR ROM:   AROM eval  Flexion 75%  Extension 10%  Right lateral flexion 75%  Left lateral flexion 75%  Right rotation   Left rotation    (Blank rows = not tested)  LOWER EXTREMITY ROM:   WFL  Active  Right eval Left eval  Hip flexion    Hip extension    Hip abduction    Hip adduction    Hip internal rotation    Hip external rotation    Knee flexion    Knee extension    Ankle dorsiflexion    Ankle plantarflexion    Ankle inversion    Ankle eversion     (Blank rows = not tested)  LOWER EXTREMITY MMT:    MMT Right eval Left eval  Hip flexion    Hip extension    Hip abduction    Hip adduction    Hip internal rotation    Hip external rotation    Knee flexion    Knee extension    Ankle dorsiflexion    Ankle plantarflexion    Ankle inversion    Ankle eversion     (Blank rows = not tested)  LUMBAR SPECIAL TESTS:  Straight leg raise test: Negative and Slump test: Negative  FUNCTIONAL TESTS:  30 seconds chair stand test 8 reps arms crossed  GAIT: Distance walked: 20ftx2 Assistive device utilized: None Level of assistance:  Complete Independence Comments: antalgic  TREATMENT:        [facility] Adult PT Treatment:                                                DATE: 12/24/23 Eval and HEP Self Care: Additional minutes spent for educating on updated Therapeutic Home Exercise Program as well as comparing current status to condition at start of symptoms. This included exercises focusing on stretching, strengthening, with focus on eccentric aspects. Long term goals include an improvement in range of motion, strength, endurance as well as avoiding reinjury. Patient's frequency would include in 1-2 times a day, 3-5 times a week for a duration of 6-12 weeks. Proper technique shown and discussed handout in great detail. All questions were discussed and addressed.                                                                                                                          PATIENT EDUCATION:  Education details: Discussed eval findings, rehab rationale and POC and patient is in agreement  Person educated: Patient Education method: Explanation and Handouts Education comprehension: verbalized understanding and needs further education  HOME EXERCISE PROGRAM: Access Code: [email or web address] URL: [email or web address] Date: 12/24/2023 Prepared by: Reyes Kohut  Exercises - Static Prone on Elbows  - 2 x daily - 5 x weekly - 1 sets - 1 reps - 2 min hold - Supine 90/90 Abdominal Bracing  - 2 x daily - 5 x weekly - 1 sets - 2 reps - 60s hold - Seated Table Hamstring Stretch  - 2 x daily - 5 x weekly - 1 sets - 2 reps - 30s hold  ASSESSMENT:  CLINICAL IMPRESSION: Patient is a 68 y.o. male who was seen today for physical therapy evaluation and treatment for chronic low back pain due to suspected degenerative changes/stenosis, no imaging studies to reference.  Patient presents with mobility restrictions in lumbar extension, negative neural tension signs, weakness in core and LE strength and 20% perceived disability on  ODI.  Patient is a good candidate for OPPT to regain mobility and increase core and  LE strength/function.  OBJECTIVE IMPAIRMENTS: Abnormal gait, decreased activity tolerance, decreased knowledge of condition, difficulty walking, decreased strength, improper body mechanics, obesity, and pain.   ACTIVITY LIMITATIONS: carrying, lifting, standing, and prolonged walking  PERSONAL FACTORS: Fitness, Past/current experiences, and Time since onset of injury/illness/exacerbation are also affecting patient's functional outcome.   REHAB POTENTIAL: Good  CLINICAL DECISION MAKING: Stable/uncomplicated  EVALUATION COMPLEXITY: Moderate   GOALS: Goals reviewed with patient? No  SHORT TERM GOALS: Target date: 01/14/2024  Patient to demonstrate independence in HEP  Baseline: Goal status: INITIAL  2.  80d hamstring mobility B Baseline: 70d B Goal status: INITIAL   LONG TERM GOALS: Target date: 02/18/2024  Patient will increase 30s chair stand reps from 8 to 10 without arms  to demonstrate and improved functional ability with less pain/difficulty as well as reduce fall risk.  Baseline: 8 Goal status: INITIAL  2.  Patient will acknowledge 4/10 worst pain at least once during episode of care   Baseline: 7/10 worst Goal status: INITIAL  3.  Patient will score at least 6/50 on ODI to signify clinically meaningful improvement in functional abilities.   Baseline: 10/50 Goal status: INITIAL  4.  Increase lumbar extension to 50% Baseline:  AROM eval  Flexion 75%  Extension 10%  Right lateral flexion 75%  Left lateral flexion 75%   Goal status: INITIAL  PLAN:  PT FREQUENCY: 1-2x/week  PT DURATION: 6 weeks  PLANNED INTERVENTIONS: 97110-Therapeutic exercises, 97530- Therapeutic activity, [id number]- Neuromuscular re-education, 97535- Self Care, 02859- Manual therapy, [id number]- Gait training, [phone] (1-2 muscles), 20561 (3+ muscles)- Dry Needling, Patient/Family education, Balance training, and Stair  training.  PLAN FOR NEXT SESSION: HEP review and update, manual techniques as appropriate, aerobic tasks, ROM and flexibility activities, strengthening and PREs, TPDN, gait and balance training as needed     Reyes CHRISTELLA Kohut, PT 12/24/2023, 12:58 PM

## 2023-12-25 DIAGNOSIS — L821 Other seborrheic keratosis: Secondary | ICD-10-CM | POA: Diagnosis not present

## 2023-12-25 DIAGNOSIS — L814 Other melanin hyperpigmentation: Secondary | ICD-10-CM | POA: Diagnosis not present

## 2023-12-25 DIAGNOSIS — D225 Melanocytic nevi of trunk: Secondary | ICD-10-CM | POA: Diagnosis not present

## 2023-12-25 DIAGNOSIS — Z85828 Personal history of other malignant neoplasm of skin: Secondary | ICD-10-CM | POA: Diagnosis not present

## 2023-12-25 DIAGNOSIS — L570 Actinic keratosis: Secondary | ICD-10-CM | POA: Diagnosis not present

## 2023-12-30 ENCOUNTER — Ambulatory Visit: Admitting: Physical Therapy

## 2023-12-30 ENCOUNTER — Encounter: Payer: Self-pay | Admitting: Physical Therapy

## 2023-12-30 ENCOUNTER — Other Ambulatory Visit: Payer: Self-pay | Admitting: Infectious Diseases

## 2023-12-30 DIAGNOSIS — R2689 Other abnormalities of gait and mobility: Secondary | ICD-10-CM

## 2023-12-30 DIAGNOSIS — I259 Chronic ischemic heart disease, unspecified: Secondary | ICD-10-CM | POA: Diagnosis not present

## 2023-12-30 DIAGNOSIS — M5459 Other low back pain: Secondary | ICD-10-CM

## 2023-12-30 DIAGNOSIS — B20 Human immunodeficiency virus [HIV] disease: Secondary | ICD-10-CM

## 2023-12-30 DIAGNOSIS — M6281 Muscle weakness (generalized): Secondary | ICD-10-CM

## 2023-12-30 DIAGNOSIS — R600 Localized edema: Secondary | ICD-10-CM | POA: Diagnosis not present

## 2023-12-30 MED ORDER — INFLUENZA VAC SPLIT HIGH-DOSE 0.5 ML IM SUSY: 0.5000 mL | PREFILLED_SYRINGE | Freq: Once | INTRAMUSCULAR | 0 refills | Status: AC

## 2023-12-30 MED ORDER — COVID-19 MRNA VAC-TRIS(PFIZER) 30 MCG/0.3ML IM SUSY: 0.3000 mL | PREFILLED_SYRINGE | Freq: Once | INTRAMUSCULAR | 0 refills | Status: AC

## 2023-12-30 NOTE — Therapy (Signed)
 OUTPATIENT PHYSICAL THERAPY THORACOLUMBAR EVALUATION   Patient Name: Riley Cortez MRN: [id number] DOB:02/05/1956, 68 y.o., male Today's Date: 12/30/2023  END OF SESSION:  PT End of Session - 12/30/23 0913     Visit Number 2    Number of Visits 12    Date for PT Re-Evaluation 02/23/24    Authorization Type BCBS    PT Start Time 0915    PT Stop Time 0953    PT Time Calculation (min) 38 min    Activity Tolerance Patient tolerated treatment well    Behavior During Therapy WFL for tasks assessed/performed          Past Medical History:  Diagnosis Date   Cancer (HCC)    skin   HIV infection (HCC)    Hyperlipidemia    Myocardial infarct [facility])    Past Surgical History:  Procedure Laterality Date   NASAL SEPTUM SURGERY     PTCA     skin cancer removal     Patient Active Problem List   Diagnosis Date Noted   COVID-19 04/07/2020   Nephrolithiasis 12/22/2019   Lymphedema of lower extremity 11/11/2018   Edema 10/14/2017   Obesity (BMI 30-39.9) 12/30/2013   TIA (transient ischemic attack) 01/22/2012   DISEASE, DISSEMINATED, D/T MYCOBACTERIA 06/14/2006   Herpes simplex virus (HSV) infection 06/14/2006   Human immunodeficiency virus (HIV) disease (HCC) 01/31/2006   Hyperlipidemia 01/31/2006   Chorioretinitis 01/31/2006   MYOCARDIAL INFARCTION, HX OF 05/10/2001   CORONARY ARTERY DISEASE, S/P PTCA 05/10/2001    PCP: Loretha Richerd SAUNDERS, MD   REFERRING PROVIDER: Eben Reyes BROCKS, MD  REFERRING DIAG: R60.0 (ICD-10-CM) - Localized edema I25.9 (ICD-10-CM) - Chronic ischemic heart disease, unspecified  Rationale for Evaluation and Treatment: Rehabilitation  THERAPY DIAG:  Other low back pain  Muscle weakness (generalized)  Other abnormalities of gait and mobility  ONSET DATE: 5 years  SUBJECTIVE:                                                                                                                                                                                            SUBJECTIVE STATEMENT: Patient arrives to OPPT with c/o low back pain ongoing over 5 years.  Denies LE symptoms or weakness  PERTINENT HISTORY:  68 yo M with hx of HIV+, hyperlipidemia, and HTN.  Is on atripla (via assistance fund), lipitor.   Prev TIA Sept 2013.  Prev basal cell CA of R leg excised. repeat excision 09-2022. Has regular derm f/u 12-2023.  Had kidney boulder removed 2020 (litho, stent, removal).     Traveling as usual, [place] ([address] [place]), [place], [place],  [place] ([place]), [place].    Has been doing well with his atripla. Some issues with freq refills (monthly).  Has been having more LE swelling, on diuretic, given compression socks. Was seen by [facility].   PAIN:  Are you having pain? Yes: NPRS scale: 1-7/10 Pain location: low back Pain description: ache Aggravating factors: standing, mowing and prolonged walking Relieving factors: sitting  PRECAUTIONS: Other: HIV  RED FLAGS: None   WEIGHT BEARING RESTRICTIONS: No  FALLS:  Has patient fallen in last 6 months? Yes. Number of falls 1due to low BP   OCCUPATION: retired  PLOF: Independent  PATIENT GOALS: To manage my low back   NEXT MD VISIT: TBD  OBJECTIVE:  Note: Objective measures were completed at Evaluation unless otherwise noted.  DIAGNOSTIC FINDINGS:  none  PATIENT SURVEYS:  Modified Oswestry:   Interpretation of scores: Score Category Description  0-20% Minimal Disability The patient can cope with most living activities. Usually no treatment is indicated apart from advice on lifting, sitting and exercise  21-40% Moderate Disability The patient experiences more pain and difficulty with sitting, lifting and standing. Travel and social life are more difficult and they may be disabled from work. Personal care, sexual activity and sleeping are not grossly affected, and the patient can usually be managed by conservative means  41-60% Severe Disability Pain remains the main problem  in this group, but activities of daily living are affected. These patients require a detailed investigation  61-80% Crippled Back pain impinges on all aspects of the patient's life. Positive intervention is required  81-100% Bed-bound  These patients are either bed-bound or exaggerating their symptoms  Bluford FORBES Zoe DELENA Karon DELENA, et al. Surgery versus conservative management of stable thoracolumbar fracture: the PRESTO feasibility RCT. [place] ([place]): [employer]; 2021 Nov. [facility] Technology Assessment, No. 25.62.) Appendix 3, Oswestry Disability Index category descriptors. Available from: FindJewelers.cz  Minimally Clinically Important Difference (MCID) = 12.8%  MUSCLE LENGTH: Hamstrings: Right 70 deg; Left 70 deg PKB negative B  POSTURE: decreased lumbar lordosis  PALPATION: deferred  LUMBAR ROM:   AROM eval  Flexion 75%  Extension 10%  Right lateral flexion 75%  Left lateral flexion 75%  Right rotation   Left rotation    (Blank rows = not tested)  LOWER EXTREMITY ROM:   WFL  Active  Right eval Left eval  Hip flexion    Hip extension    Hip abduction    Hip adduction    Hip internal rotation    Hip external rotation    Knee flexion    Knee extension    Ankle dorsiflexion    Ankle plantarflexion    Ankle inversion    Ankle eversion     (Blank rows = not tested)  LOWER EXTREMITY MMT:    MMT Right eval Left eval  Hip flexion    Hip extension    Hip abduction    Hip adduction    Hip internal rotation    Hip external rotation    Knee flexion    Knee extension    Ankle dorsiflexion    Ankle plantarflexion    Ankle inversion    Ankle eversion     (Blank rows = not tested)  LUMBAR SPECIAL TESTS:  Straight leg raise test: Negative and Slump test: Negative  FUNCTIONAL TESTS:  30 seconds chair stand test 8 reps arms crossed  GAIT: Distance walked: 63ftx2 Assistive device utilized: None Level of assistance:  Complete Independence Comments: antalgic  TREATMENT:        [facility] Adult PT Treatment:                                                DATE: 12/30/2023 Neuromuscular re-ed: Pball roll up 2x12, hold  Cue for breathing out during hold Supine QL stretch 2x1' B Seated hamstring stretch with APT 2x1' B Therapeutic Activity: NuStep 8' for activity tolerance Supine bridge 2x12, hold 4s STS  2x12, cue for controlled ecc.   [facility] Adult PT Treatment:                                                DATE: 12/24/23 Eval and HEP Self Care: Additional minutes spent for educating on updated Therapeutic Home Exercise Program as well as comparing current status to condition at start of symptoms. This included exercises focusing on stretching, strengthening, with focus on eccentric aspects. Long term goals include an improvement in range of motion, strength, endurance as well as avoiding reinjury. Patient's frequency would include in 1-2 times a day, 3-5 times a week for a duration of 6-12 weeks. Proper technique shown and discussed handout in great detail. All questions were discussed and addressed.                                                                                                                          PATIENT EDUCATION:  Education details: Discussed eval findings, rehab rationale and POC and patient is in agreement  Person educated: Patient Education method: Explanation and Handouts Education comprehension: verbalized understanding and needs further education  HOME EXERCISE PROGRAM: Access Code: [email or web address] URL: [email or web address] Date: 12/24/2023 Prepared by: Reyes Kohut  Exercises - Static Prone on Elbows  - 2 x daily - 5 x weekly - 1 sets - 1 reps - 2 min hold - Supine 90/90 Abdominal Bracing  - 2 x daily - 5 x weekly - 1 sets - 2 reps - 60s hold - Seated Table Hamstring Stretch  - 2 x daily - 5 x weekly - 1 sets - 2 reps - 30s hold  ASSESSMENT:  CLINICAL  IMPRESSION: Pt attended physical therapy session for continuation of treatment regarding LBP. Today's treatment focused on improvement of  posterior chain motility, lumbar stability, dynamic core strengthening, and global hip strength. Pt reports minimal change since last session, demonstrated improved functional lumbar and hip mobility as well as understanding ecc control of transfer quality. Pt showed great tolerance to administered treatment with no adverse effects by the end of session. Skilled intervention was utilized via activity modification for pt tolerance with task completion, functional progression/regression promoting best outcomes inline with current rehab goals, as well as minimal verbal/tactile cuing alongside no  physical assistance for safe and appropriate performance of today's activities. Continue to progress within current POC focus, update HEP PRN.   Patient is a 68 y.o. male who was seen today for physical therapy evaluation and treatment for chronic low back pain due to suspected degenerative changes/stenosis, no imaging studies to reference.  Patient presents with mobility restrictions in lumbar extension, negative neural tension signs, weakness in core and LE strength and 20% perceived disability on ODI.  Patient is a good candidate for OPPT to regain mobility and increase core and  LE strength/function.  OBJECTIVE IMPAIRMENTS: Abnormal gait, decreased activity tolerance, decreased knowledge of condition, difficulty walking, decreased strength, improper body mechanics, obesity, and pain.   ACTIVITY LIMITATIONS: carrying, lifting, standing, and prolonged walking  PERSONAL FACTORS: Fitness, Past/current experiences, and Time since onset of injury/illness/exacerbation are also affecting patient's functional outcome.   REHAB POTENTIAL: Good  CLINICAL DECISION MAKING: Stable/uncomplicated  EVALUATION COMPLEXITY: Moderate   GOALS: Goals reviewed with patient? No  SHORT TERM  GOALS: Target date: 01/14/2024  Patient to demonstrate independence in HEP  Baseline: Goal status: INITIAL  2.  80d hamstring mobility B Baseline: 70d B Goal status: INITIAL   LONG TERM GOALS: Target date: 02/18/2024  Patient will increase 30s chair stand reps from 8 to 10 without arms to demonstrate and improved functional ability with less pain/difficulty as well as reduce fall risk.  Baseline: 8 Goal status: INITIAL  2.  Patient will acknowledge 4/10 worst pain at least once during episode of care   Baseline: 7/10 worst Goal status: INITIAL  3.  Patient will score at least 6/50 on ODI to signify clinically meaningful improvement in functional abilities.   Baseline: 10/50 Goal status: INITIAL  4.  Increase lumbar extension to 50% Baseline:  AROM eval  Flexion 75%  Extension 10%  Right lateral flexion 75%  Left lateral flexion 75%   Goal status: INITIAL  PLAN:  PT FREQUENCY: 1-2x/week  PT DURATION: 6 weeks  PLANNED INTERVENTIONS: 97110-Therapeutic exercises, 97530- Therapeutic activity, [id number]- Neuromuscular re-education, 97535- Self Care, 02859- Manual therapy, [id number]- Gait training, [phone] (1-2 muscles), 20561 (3+ muscles)- Dry Needling, Patient/Family education, Balance training, and Stair training.  PLAN FOR NEXT SESSION: HEP review and update, manual techniques as appropriate, aerobic tasks, ROM and flexibility activities, strengthening and PREs, TPDN, gait and balance training as needed     Mabel Kiang, PT, DPT 12/30/2023, 10:19 AM

## 2024-01-07 ENCOUNTER — Ambulatory Visit: Admitting: Physical Therapy

## 2024-01-07 DIAGNOSIS — M6281 Muscle weakness (generalized): Secondary | ICD-10-CM

## 2024-01-07 DIAGNOSIS — M5459 Other low back pain: Secondary | ICD-10-CM | POA: Diagnosis not present

## 2024-01-07 DIAGNOSIS — R2689 Other abnormalities of gait and mobility: Secondary | ICD-10-CM

## 2024-01-07 DIAGNOSIS — I259 Chronic ischemic heart disease, unspecified: Secondary | ICD-10-CM | POA: Diagnosis not present

## 2024-01-07 DIAGNOSIS — R600 Localized edema: Secondary | ICD-10-CM | POA: Diagnosis not present

## 2024-01-07 NOTE — Therapy (Signed)
 OUTPATIENT PHYSICAL THERAPY THORACOLUMBAR EVALUATION   Patient Name: Riley Cortez MRN: [id number] DOB:06-26-1955, 68 y.o., male Today's Date: 01/07/2024  END OF SESSION:  PT End of Session - 01/07/24 1220     Visit Number 3    Number of Visits 12    Date for PT Re-Evaluation 02/23/24    Authorization Type BCBS    PT Start Time 1205    PT Stop Time 1245    PT Time Calculation (min) 40 min    Activity Tolerance Patient tolerated treatment well    Behavior During Therapy WFL for tasks assessed/performed           Past Medical History:  Diagnosis Date   Cancer (HCC)    skin   HIV infection (HCC)    Hyperlipidemia    Myocardial infarct [facility])    Past Surgical History:  Procedure Laterality Date   NASAL SEPTUM SURGERY     PTCA     skin cancer removal     Patient Active Problem List   Diagnosis Date Noted   COVID-19 04/07/2020   Nephrolithiasis 12/22/2019   Lymphedema of lower extremity 11/11/2018   Edema 10/14/2017   Obesity (BMI 30-39.9) 12/30/2013   TIA (transient ischemic attack) 01/22/2012   DISEASE, DISSEMINATED, D/T MYCOBACTERIA 06/14/2006   Herpes simplex virus (HSV) infection 06/14/2006   Human immunodeficiency virus (HIV) disease (HCC) 01/31/2006   Hyperlipidemia 01/31/2006   Chorioretinitis 01/31/2006   MYOCARDIAL INFARCTION, HX OF 05/10/2001   CORONARY ARTERY DISEASE, S/P PTCA 05/10/2001    PCP: Loretha Richerd SAUNDERS, MD   REFERRING PROVIDER: Eben Reyes BROCKS, MD  REFERRING DIAG: R60.0 (ICD-10-CM) - Localized edema I25.9 (ICD-10-CM) - Chronic ischemic heart disease, unspecified  Rationale for Evaluation and Treatment: Rehabilitation  THERAPY DIAG:  Other low back pain  Muscle weakness (generalized)  Other abnormalities of gait and mobility  ONSET DATE: 5 years  SUBJECTIVE:                                                                                                                                                                                            SUBJECTIVE STATEMENT: Patient arrives to OPPT with c/o low back pain ongoing over 5 years.  Denies LE symptoms or weakness  PERTINENT HISTORY:  68 yo M with hx of HIV+, hyperlipidemia, and HTN.  Is on atripla (via assistance fund), lipitor.   Prev TIA Sept 2013.  Prev basal cell CA of R leg excised. repeat excision 09-2022. Has regular derm f/u 12-2023.  Had kidney boulder removed 2020 (litho, stent, removal).     Traveling as usual, [place] ([address] [place]), [place], [place] ([place]), [place].    Has been doing well with his atripla. Some issues with freq refills (monthly).  Has been having more LE swelling, on diuretic, given compression socks. Was seen by [facility].   PAIN:  Are you having pain? Yes: NPRS scale: 1-7/10 Pain location: low back Pain description: ache Aggravating factors: standing, mowing and prolonged walking Relieving factors: sitting  PRECAUTIONS: Other: HIV  RED FLAGS: None   WEIGHT BEARING RESTRICTIONS: No  FALLS:  Has patient fallen in last 6 months? Yes. Number of falls 1due to low BP   OCCUPATION: retired  PLOF: Independent  PATIENT GOALS: To manage my low back   NEXT MD VISIT: TBD  OBJECTIVE:  Note: Objective measures were completed at Evaluation unless otherwise noted.  DIAGNOSTIC FINDINGS:  none  PATIENT SURVEYS:  Modified Oswestry:   Interpretation of scores: Score Category Description  0-20% Minimal Disability The patient can cope with most living activities. Usually no treatment is indicated apart from advice on lifting, sitting and exercise  21-40% Moderate Disability The patient experiences more pain and difficulty with sitting, lifting and standing. Travel and social life are more difficult and they may be disabled from work. Personal care, sexual activity and sleeping are not grossly affected, and the patient can usually be managed by conservative means  41-60% Severe Disability Pain remains the main  problem in this group, but activities of daily living are affected. These patients require a detailed investigation  61-80% Crippled Back pain impinges on all aspects of the patient's life. Positive intervention is required  81-100% Bed-bound  These patients are either bed-bound or exaggerating their symptoms  Bluford FORBES Zoe DELENA Karon DELENA, et al. Surgery versus conservative management of stable thoracolumbar fracture: the PRESTO feasibility RCT. [place] ([place]): [employer]; 2021 Nov. [facility] Technology Assessment, No. 25.62.) Appendix 3, Oswestry Disability Index category descriptors. Available from: FindJewelers.cz  Minimally Clinically Important Difference (MCID) = 12.8%  MUSCLE LENGTH: Hamstrings: Right 70 deg; Left 70 deg PKB negative B  POSTURE: decreased lumbar lordosis  PALPATION: deferred  LUMBAR ROM:   AROM eval  Flexion 75%  Extension 10%  Right lateral flexion 75%  Left lateral flexion 75%  Right rotation   Left rotation    (Blank rows = not tested)  LOWER EXTREMITY ROM:   WFL  Active  Right eval Left eval  Hip flexion    Hip extension    Hip abduction    Hip adduction    Hip internal rotation    Hip external rotation    Knee flexion    Knee extension    Ankle dorsiflexion    Ankle plantarflexion    Ankle inversion    Ankle eversion     (Blank rows = not tested)  LOWER EXTREMITY MMT:    MMT Right eval Left eval  Hip flexion    Hip extension    Hip abduction    Hip adduction    Hip internal rotation    Hip external rotation    Knee flexion    Knee extension    Ankle dorsiflexion    Ankle plantarflexion    Ankle inversion    Ankle eversion     (Blank rows = not tested)  LUMBAR SPECIAL TESTS:  Straight leg raise test: Negative and Slump test: Negative  FUNCTIONAL TESTS:  30 seconds chair stand test 8 reps arms crossed  GAIT: Distance walked: 56ftx2 Assistive device utilized: None Level of  assistance: Complete Independence Comments: antalgic  TREATMENT:    [facility] Adult PT Treatment:                                                DATE: 01/07/2024  Therapeutic Exercise: Supine bridge on Pball Supine EOB hip flexor stretch 2x1' Seated hamstring stretch 2x1'  Therapeutic Activity: Rec bike 8'  Standing hip 3 way  2x12 B, hold 1s STS from low surface 2x12, no ue a. Hip hinge practice        [facility] Adult PT Treatment:                                                DATE: 12/30/2023 Neuromuscular re-ed: Pball roll up 2x12, hold  Cue for breathing out during hold Supine QL stretch 2x1' B Seated hamstring stretch with APT 2x1' B Therapeutic Activity: NuStep 8' for activity tolerance Supine bridge 2x12, hold 4s STS  2x12, cue for controlled ecc.   [facility] Adult PT Treatment:                                                DATE: 12/24/23 Eval and HEP Self Care: Additional minutes spent for educating on updated Therapeutic Home Exercise Program as well as comparing current status to condition at start of symptoms. This included exercises focusing on stretching, strengthening, with focus on eccentric aspects. Long term goals include an improvement in range of motion, strength, endurance as well as avoiding reinjury. Patient's frequency would include in 1-2 times a day, 3-5 times a week for a duration of 6-12 weeks. Proper technique shown and discussed handout in great detail. All questions were discussed and addressed.                                                                                                                          PATIENT EDUCATION:  Education details: Discussed eval findings, rehab rationale and POC and patient is in agreement  Person educated: Patient Education method: Explanation and Handouts Education comprehension: verbalized understanding and needs further education  HOME EXERCISE PROGRAM: Access Code: [email or web address] URL:  [email or web address] Date: 12/24/2023 Prepared by: Reyes Kohut  Exercises - Static Prone on Elbows  - 2 x daily - 5 x weekly - 1 sets - 1 reps - 2 min hold - Supine 90/90 Abdominal Bracing  - 2 x daily - 5 x weekly - 1 sets - 2 reps - 60s hold - Seated Table Hamstring Stretch  - 2 x daily - 5 x weekly - 1 sets - 2 reps - 30s hold  ASSESSMENT:  CLINICAL IMPRESSION:  Pt attended physical therapy session for continuation of treatment regarding LBP. Today's treatment focused on improvement of  posterior chain motility, lumbar stability, dynamic core strengthening, and global hip strength. Pt demonstrated improved transfer quality and ecc control. Pt also reported the ability to move a heavy grill to the road from his backyard with no increase in pain over the weekend. Pt showed great tolerance to administered treatment with no adverse effects by the end of session. Skilled intervention was utilized via activity modification for pt tolerance with task completion, functional progression/regression promoting best outcomes inline with current rehab goals, as well as minimal verbal/tactile cuing alongside no physical assistance for safe and appropriate performance of today's activities. Continue to progress within current POC focus, update HEP PRN.   Patient is a 68 y.o. male who was seen today for physical therapy evaluation and treatment for chronic low back pain due to suspected degenerative changes/stenosis, no imaging studies to reference.  Patient presents with mobility restrictions in lumbar extension, negative neural tension signs, weakness in core and LE strength and 20% perceived disability on ODI.  Patient is a good candidate for OPPT to regain mobility and increase core and  LE strength/function.  OBJECTIVE IMPAIRMENTS: Abnormal gait, decreased activity tolerance, decreased knowledge of condition, difficulty walking, decreased strength, improper body mechanics, obesity, and pain.    ACTIVITY LIMITATIONS: carrying, lifting, standing, and prolonged walking  PERSONAL FACTORS: Fitness, Past/current experiences, and Time since onset of injury/illness/exacerbation are also affecting patient's functional outcome.   REHAB POTENTIAL: Good  CLINICAL DECISION MAKING: Stable/uncomplicated  EVALUATION COMPLEXITY: Moderate   GOALS: Goals reviewed with patient? No  SHORT TERM GOALS: Target date: 01/14/2024  Patient to demonstrate independence in HEP  Baseline: Goal status: INITIAL  2.  80d hamstring mobility B Baseline: 70d B Goal status: INITIAL   LONG TERM GOALS: Target date: 02/18/2024  Patient will increase 30s chair stand reps from 8 to 10 without arms to demonstrate and improved functional ability with less pain/difficulty as well as reduce fall risk.  Baseline: 8 Goal status: INITIAL  2.  Patient will acknowledge 4/10 worst pain at least once during episode of care   Baseline: 7/10 worst Goal status: INITIAL  3.  Patient will score at least 6/50 on ODI to signify clinically meaningful improvement in functional abilities.   Baseline: 10/50 Goal status: INITIAL  4.  Increase lumbar extension to 50% Baseline:  AROM eval  Flexion 75%  Extension 10%  Right lateral flexion 75%  Left lateral flexion 75%   Goal status: INITIAL  PLAN:  PT FREQUENCY: 1-2x/week  PT DURATION: 6 weeks  PLANNED INTERVENTIONS: 97110-Therapeutic exercises, 97530- Therapeutic activity, [id number]- Neuromuscular re-education, 97535- Self Care, [id number]- Manual therapy, [id number]- Gait training, [phone] (1-2 muscles), [id number] (3+ muscles)- Dry Needling, Patient/Family education, Balance training, and Stair training.  PLAN FOR NEXT SESSION: HEP review and update, manual techniques as appropriate, aerobic tasks, ROM and flexibility activities, strengthening and PREs, TPDN, gait and balance training as needed     Mabel Kiang, PT, DPT 01/07/2024, 12:46 PM

## 2024-01-09 ENCOUNTER — Ambulatory Visit

## 2024-01-09 DIAGNOSIS — R2689 Other abnormalities of gait and mobility: Secondary | ICD-10-CM | POA: Diagnosis not present

## 2024-01-09 DIAGNOSIS — M6281 Muscle weakness (generalized): Secondary | ICD-10-CM | POA: Diagnosis not present

## 2024-01-09 DIAGNOSIS — M5459 Other low back pain: Secondary | ICD-10-CM

## 2024-01-09 DIAGNOSIS — I259 Chronic ischemic heart disease, unspecified: Secondary | ICD-10-CM | POA: Diagnosis not present

## 2024-01-09 DIAGNOSIS — R600 Localized edema: Secondary | ICD-10-CM | POA: Diagnosis not present

## 2024-01-09 NOTE — Therapy (Signed)
 OUTPATIENT PHYSICAL THERAPY TREATMENT NOTE   Patient Name: Riley Cortez MRN: [id number] DOB:[birth year], 68 y.o., male Today's Date: 01/09/2024  END OF SESSION:  PT End of Session - 01/09/24 1202     Visit Number 4    Number of Visits 12    Date for Recertification  02/23/24    Authorization Type BCBS    PT Start Time 1210    PT Stop Time 1248    PT Time Calculation (min) 38 min    Activity Tolerance Patient tolerated treatment well    Behavior During Therapy WFL for tasks assessed/performed          Past Medical History:  Diagnosis Date   Cancer (HCC)    skin   HIV infection (HCC)    Hyperlipidemia    Myocardial infarct [facility])    Past Surgical History:  Procedure Laterality Date   NASAL SEPTUM SURGERY     PTCA     skin cancer removal     Patient Active Problem List   Diagnosis Date Noted   COVID-19 04/07/2020   Nephrolithiasis 12/22/2019   Lymphedema of lower extremity 11/11/2018   Edema 10/14/2017   Obesity (BMI 30-39.9) 12/30/2013   TIA (transient ischemic attack) 01/22/2012   DISEASE, DISSEMINATED, D/T MYCOBACTERIA 06/14/2006   Herpes simplex virus (HSV) infection 06/14/2006   Human immunodeficiency virus (HIV) disease (HCC) 01/31/2006   Hyperlipidemia 01/31/2006   Chorioretinitis 01/31/2006   MYOCARDIAL INFARCTION, HX OF 05/10/2001   CORONARY ARTERY DISEASE, S/P PTCA 05/10/2001    PCP: Loretha Richerd SAUNDERS, MD   REFERRING PROVIDER: Eben Reyes BROCKS, MD  REFERRING DIAG: R60.0 (ICD-10-CM) - Localized edema I25.9 (ICD-10-CM) - Chronic ischemic heart disease, unspecified  Rationale for Evaluation and Treatment: Rehabilitation  THERAPY DIAG:  Other low back pain  Muscle weakness (generalized)  Other abnormalities of gait and mobility  ONSET DATE: 5 years  SUBJECTIVE:                                                                                                                                                                                            SUBJECTIVE STATEMENT: Patient reports his back feels ok, was not sore after last session.  PERTINENT HISTORY:  68 yo M with hx of HIV+, hyperlipidemia, and HTN.  Is on atripla (via assistance fund), lipitor.   Prev TIA Sept 2013.  Prev basal cell CA of R leg excised. repeat excision 09-2022. Has regular derm f/u 12-2023.  Had kidney boulder removed 2020 (litho, stent, removal).     Traveling as usual, [place] ([address], [address]), [place], [place], [place] ([place]), [place].  Has been doing well with his atripla. Some issues with freq refills (monthly).  Has been having more LE swelling, on diuretic, given compression socks. Was seen by [facility].   PAIN:  Are you having pain? Yes: NPRS scale: 1-7/10 Pain location: low back Pain description: ache Aggravating factors: standing, mowing and prolonged walking Relieving factors: sitting  PRECAUTIONS: Other: HIV  RED FLAGS: None   WEIGHT BEARING RESTRICTIONS: No  FALLS:  Has patient fallen in last 6 months? Yes. Number of falls 1due to low BP   OCCUPATION: retired  PLOF: Independent  PATIENT GOALS: To manage my low back   NEXT MD VISIT: TBD  OBJECTIVE:  Note: Objective measures were completed at Evaluation unless otherwise noted.  DIAGNOSTIC FINDINGS:  none  PATIENT SURVEYS:  Modified Oswestry:   Interpretation of scores: Score Category Description  0-20% Minimal Disability The patient can cope with most living activities. Usually no treatment is indicated apart from advice on lifting, sitting and exercise  21-40% Moderate Disability The patient experiences more pain and difficulty with sitting, lifting and standing. Travel and social life are more difficult and they may be disabled from work. Personal care, sexual activity and sleeping are not grossly affected, and the patient can usually be managed by conservative means  41-60% Severe Disability Pain remains the main problem in this group, but activities of daily  living are affected. These patients require a detailed investigation  61-80% Crippled Back pain impinges on all aspects of the patient's life. Positive intervention is required  81-100% Bed-bound  These patients are either bed-bound or exaggerating their symptoms  Bluford FORBES Zoe DELENA Karon DELENA, et al. Surgery versus conservative management of stable thoracolumbar fracture: the PRESTO feasibility RCT. [place] ([place]): [employer]; 2021 Nov. [facility] Technology Assessment, No. 25.62.) Appendix 3, Oswestry Disability Index category descriptors. Available from: FindJewelers.cz  Minimally Clinically Important Difference (MCID) = 12.8%  MUSCLE LENGTH: Hamstrings: Right 70 deg; Left 70 deg PKB negative B  POSTURE: decreased lumbar lordosis  PALPATION: deferred  LUMBAR ROM:   AROM eval  Flexion 75%  Extension 10%  Right lateral flexion 75%  Left lateral flexion 75%  Right rotation   Left rotation    (Blank rows = not tested)  LOWER EXTREMITY ROM:   WFL  Active  Right eval Left eval  Hip flexion    Hip extension    Hip abduction    Hip adduction    Hip internal rotation    Hip external rotation    Knee flexion    Knee extension    Ankle dorsiflexion    Ankle plantarflexion    Ankle inversion    Ankle eversion     (Blank rows = not tested)  LOWER EXTREMITY MMT:    MMT Right eval Left eval  Hip flexion    Hip extension    Hip abduction    Hip adduction    Hip internal rotation    Hip external rotation    Knee flexion    Knee extension    Ankle dorsiflexion    Ankle plantarflexion    Ankle inversion    Ankle eversion     (Blank rows = not tested)  LUMBAR SPECIAL TESTS:  Straight leg raise test: Negative and Slump test: Negative  FUNCTIONAL TESTS:  30 seconds chair stand test 8 reps arms crossed  GAIT: Distance walked: 78ftx2 Assistive device utilized: None Level of assistance: Complete Independence Comments:  antalgic  TREATMENT:    [facility] Adult PT  Treatment:                                                DATE: 01/09/24 Therapeutic Exercise: Bike level 3 x 5 mins while gathering subjective and planning session with patient Seated hamstring stretch 2x30 BIL Seated thoracic ext arms across chest x2' Supine bridge 2x10 3 hold at top Modified thomas stretch EOM x1' BIL Therapeutic Activity: STS 2x10 arms crossed Standing 3 way hip on theraband oval foam pad 2x10 BIL   [facility] Adult PT Treatment:                                                DATE: 01/07/2024  Therapeutic Exercise: Supine bridge on Pball Supine EOB hip flexor stretch 2x1' Seated hamstring stretch 2x1'  Therapeutic Activity: Rec bike 8'  Standing hip 3 way  2x12 B, hold 1s STS from low surface 2x12, no ue a. Hip hinge practice        [facility] Adult PT Treatment:                                                DATE: 12/30/2023 Neuromuscular re-ed: Pball roll up 2x12, hold  Cue for breathing out during hold Supine QL stretch 2x1' B Seated hamstring stretch with APT 2x1' B Therapeutic Activity: NuStep 8' for activity tolerance Supine bridge 2x12, hold 4s STS  2x12, cue for controlled ecc.   PATIENT EDUCATION:  Education details: Discussed eval findings, rehab rationale and POC and patient is in agreement  Person educated: Patient Education method: Explanation and Handouts Education comprehension: verbalized understanding and needs further education  HOME EXERCISE PROGRAM: Access Code: [email or web address] URL: [email or web address] Date: 12/24/2023 Prepared by: Reyes Kohut  Exercises - Static Prone on Elbows  - 2 x daily - 5 x weekly - 1 sets - 1 reps - 2 min hold - Supine 90/90 Abdominal Bracing  - 2 x daily - 5 x weekly - 1 sets - 2 reps - 60s hold - Seated Table Hamstring Stretch  - 2 x daily - 5 x weekly - 1 sets - 2 reps - 30s hold  ASSESSMENT:  CLINICAL IMPRESSION: Patient presents to PT  reporting minimal back pain currently and that he was not sore after previous session. Session today continued to focus on proximal hip strengthening and thoracic mobility. Patient was able to tolerate all prescribed exercises with no adverse effects. Patient continues to benefit from skilled PT services and should be progressed as able to improve functional independence.   EVAL: Patient is a 68 y.o. male who was seen today for physical therapy evaluation and treatment for chronic low back pain due to suspected degenerative changes/stenosis, no imaging studies to reference.  Patient presents with mobility restrictions in lumbar extension, negative neural tension signs, weakness in core and LE strength and 20% perceived disability on ODI.  Patient is a good candidate for OPPT to regain mobility and increase core and  LE strength/function.  OBJECTIVE IMPAIRMENTS: Abnormal gait, decreased activity tolerance, decreased knowledge of condition, difficulty walking, decreased strength, improper body mechanics, obesity, and pain.  ACTIVITY LIMITATIONS: carrying, lifting, standing, and prolonged walking  PERSONAL FACTORS: Fitness, Past/current experiences, and Time since onset of injury/illness/exacerbation are also affecting patient's functional outcome.   REHAB POTENTIAL: Good  CLINICAL DECISION MAKING: Stable/uncomplicated  EVALUATION COMPLEXITY: Moderate   GOALS: Goals reviewed with patient? No  SHORT TERM GOALS: Target date: 01/14/2024  Patient to demonstrate independence in HEP  Baseline: Goal status: INITIAL  2.  80d hamstring mobility B Baseline: 70d B Goal status: INITIAL   LONG TERM GOALS: Target date: 02/18/2024  Patient will increase 30s chair stand reps from 8 to 10 without arms to demonstrate and improved functional ability with less pain/difficulty as well as reduce fall risk.  Baseline: 8 Goal status: INITIAL  2.  Patient will acknowledge 4/10 worst pain at least once  during episode of care   Baseline: 7/10 worst Goal status: INITIAL  3.  Patient will score at least 6/50 on ODI to signify clinically meaningful improvement in functional abilities.   Baseline: 10/50 Goal status: INITIAL  4.  Increase lumbar extension to 50% Baseline:  AROM eval  Flexion 75%  Extension 10%  Right lateral flexion 75%  Left lateral flexion 75%   Goal status: INITIAL  PLAN:  PT FREQUENCY: 1-2x/week  PT DURATION: 6 weeks  PLANNED INTERVENTIONS: 97110-Therapeutic exercises, 97530- Therapeutic activity, [id number]- Neuromuscular re-education, 97535- Self Care, 02859- Manual therapy, [id number]- Gait training, [phone] (1-2 muscles), [id number] (3+ muscles)- Dry Needling, Patient/Family education, Balance training, and Stair training.  PLAN FOR NEXT SESSION: HEP review and update, manual techniques as appropriate, aerobic tasks, ROM and flexibility activities, strengthening and PREs, TPDN, gait and balance training as needed     Corean Pouch PTA  01/09/2024, 12:48 PM

## 2024-01-13 ENCOUNTER — Encounter: Admitting: Physical Therapy

## 2024-01-23 ENCOUNTER — Ambulatory Visit: Admitting: Physical Therapy

## 2024-01-27 ENCOUNTER — Telehealth: Payer: Self-pay | Admitting: Physical Therapy

## 2024-01-27 ENCOUNTER — Ambulatory Visit: Admitting: Physical Therapy

## 2024-01-28 DIAGNOSIS — Z8673 Personal history of transient ischemic attack (TIA), and cerebral infarction without residual deficits: Secondary | ICD-10-CM | POA: Diagnosis not present

## 2024-01-28 DIAGNOSIS — E782 Mixed hyperlipidemia: Secondary | ICD-10-CM | POA: Diagnosis not present

## 2024-01-28 DIAGNOSIS — I10 Essential (primary) hypertension: Secondary | ICD-10-CM | POA: Diagnosis not present

## 2024-01-28 DIAGNOSIS — I2510 Atherosclerotic heart disease of native coronary artery without angina pectoris: Secondary | ICD-10-CM | POA: Diagnosis not present

## 2024-01-29 ENCOUNTER — Encounter: Payer: Self-pay | Admitting: Physical Therapy

## 2024-01-29 ENCOUNTER — Ambulatory Visit: Attending: Infectious Diseases | Admitting: Physical Therapy

## 2024-01-29 DIAGNOSIS — R2689 Other abnormalities of gait and mobility: Secondary | ICD-10-CM | POA: Insufficient documentation

## 2024-01-29 DIAGNOSIS — M5459 Other low back pain: Secondary | ICD-10-CM | POA: Insufficient documentation

## 2024-01-29 DIAGNOSIS — M6281 Muscle weakness (generalized): Secondary | ICD-10-CM | POA: Diagnosis not present

## 2024-01-29 NOTE — Therapy (Signed)
 OUTPATIENT PHYSICAL THERAPY TREATMENT NOTE   Patient Name: Riley Cortez MRN: [id number] DOB:01/30/1956, 68 y.o., male Today's Date: 01/29/2024  END OF SESSION:  PT End of Session - 01/29/24 1038     Visit Number 5    Number of Visits 12    Date for Recertification  02/23/24    Authorization Type BCBS    PT Start Time 1037    PT Stop Time 1117    PT Time Calculation (min) 40 min    Activity Tolerance Patient tolerated treatment well    Behavior During Therapy WFL for tasks assessed/performed           Past Medical History:  Diagnosis Date   Cancer (HCC)    skin   HIV infection (HCC)    Hyperlipidemia    Myocardial infarct [facility])    Past Surgical History:  Procedure Laterality Date   NASAL SEPTUM SURGERY     PTCA     skin cancer removal     Patient Active Problem List   Diagnosis Date Noted   COVID-19 04/07/2020   Nephrolithiasis 12/22/2019   Lymphedema of lower extremity 11/11/2018   Edema 10/14/2017   Obesity (BMI 30-39.9) 12/30/2013   TIA (transient ischemic attack) 01/22/2012   DISEASE, DISSEMINATED, D/T MYCOBACTERIA 06/14/2006   Herpes simplex virus (HSV) infection 06/14/2006   Human immunodeficiency virus (HIV) disease (HCC) 01/31/2006   Hyperlipidemia 01/31/2006   Chorioretinitis 01/31/2006   MYOCARDIAL INFARCTION, HX OF 05/10/2001   CORONARY ARTERY DISEASE, S/P PTCA 05/10/2001    PCP: Loretha Richerd SAUNDERS, MD   REFERRING PROVIDER: Eben Reyes BROCKS, MD  REFERRING DIAG: R60.0 (ICD-10-CM) - Localized edema I25.9 (ICD-10-CM) - Chronic ischemic heart disease, unspecified  Rationale for Evaluation and Treatment: Rehabilitation  THERAPY DIAG:  Other low back pain  Muscle weakness (generalized)  Other abnormalities of gait and mobility  ONSET DATE: 5 years  SUBJECTIVE:                                                                                                                                                                                            SUBJECTIVE STATEMENT: Pt attended today's session with reports of 1/10 pain. Pt stated that they have maintained fair compliance with current HEP.  Hasn't bene as compliant because of recent travel to [place].   PERTINENT HISTORY:  68 yo M with hx of HIV+, hyperlipidemia, and HTN.  Is on atripla (via assistance fund), lipitor.   Prev TIA Sept 2013.  Prev basal cell CA of R leg excised. repeat excision 09-2022. Has regular derm f/u 12-2023.  Had kidney boulder removed 2020 (litho,  stent, removal).     Traveling as usual, [place] ([address], [address]  [address]), [place], [place], [place] ([place]), [place].    Has been doing well with his atripla. Some issues with freq refills (monthly).  Has been having more LE swelling, on diuretic, given compression socks. Was seen by [facility].   PAIN:  Are you having pain? Yes: NPRS scale: 1-7/10 Pain location: low back Pain description: ache Aggravating factors: standing, mowing and prolonged walking Relieving factors: sitting  PRECAUTIONS: Other: HIV  RED FLAGS: None   WEIGHT BEARING RESTRICTIONS: No  FALLS:  Has patient fallen in last 6 months? Yes. Number of falls 1due to low BP   OCCUPATION: retired  PLOF: Independent  PATIENT GOALS: To manage my low back   NEXT MD VISIT: TBD  OBJECTIVE:  Note: Objective measures were completed at Evaluation unless otherwise noted.  DIAGNOSTIC FINDINGS:  none  PATIENT SURVEYS:  Modified Oswestry:   Interpretation of scores: Score Category Description  0-20% Minimal Disability The patient can cope with most living activities. Usually no treatment is indicated apart from advice on lifting, sitting and exercise  21-40% Moderate Disability The patient experiences more pain and difficulty with sitting, lifting and standing. Travel and social life are more difficult and they may be disabled from work. Personal care, sexual activity and sleeping are not grossly affected, and the patient can usually be  managed by conservative means  41-60% Severe Disability Pain remains the main problem in this group, but activities of daily living are affected. These patients require a detailed investigation  61-80% Crippled Back pain impinges on all aspects of the patient's life. Positive intervention is required  81-100% Bed-bound  These patients are either bed-bound or exaggerating their symptoms  Bluford FORBES Zoe DELENA Karon DELENA, et al. Surgery versus conservative management of stable thoracolumbar fracture: the PRESTO feasibility RCT. [place] ([place]): [employer]; 2021 Nov. [facility] Technology Assessment, No. 25.62.) Appendix 3, Oswestry Disability Index category descriptors. Available from: FindJewelers.cz  Minimally Clinically Important Difference (MCID) = 12.8%  MUSCLE LENGTH: Hamstrings: Right 70 deg; Left 70 deg PKB negative B  POSTURE: decreased lumbar lordosis  PALPATION: deferred  LUMBAR ROM:   AROM eval  Flexion 75%  Extension 10%  Right lateral flexion 75%  Left lateral flexion 75%  Right rotation   Left rotation    (Blank rows = not tested)  LOWER EXTREMITY ROM:   WFL  Active  Right eval Left eval  Hip flexion    Hip extension    Hip abduction    Hip adduction    Hip internal rotation    Hip external rotation    Knee flexion    Knee extension    Ankle dorsiflexion    Ankle plantarflexion    Ankle inversion    Ankle eversion     (Blank rows = not tested)  LOWER EXTREMITY MMT:    MMT Right eval Left eval  Hip flexion    Hip extension    Hip abduction    Hip adduction    Hip internal rotation    Hip external rotation    Knee flexion    Knee extension    Ankle dorsiflexion    Ankle plantarflexion    Ankle inversion    Ankle eversion     (Blank rows = not tested)  LUMBAR SPECIAL TESTS:  Straight leg raise test: Negative and Slump test: Negative  FUNCTIONAL TESTS:  30 seconds chair stand test 8 reps arms  crossed  GAIT:  Distance walked: 52ftx2 Assistive device utilized: None Level of assistance: Complete Independence Comments: antalgic  TREATMENT:     [facility] Adult PT Treatment:                                                DATE: 01/29/2024 Neuromuscular re-ed: Pball roll up 2x12, hold for a full breath of air in/out Supine bridge w/ SL extension at top 2x8B, hold 3s Therapeutic Activity: NuStep 8' for activity tolerance Seated lumbar flexion w/row 2x15, springboard 4 ft away Hip hinge practice 2x 8, BW   [facility] Adult PT Treatment:                                                DATE: 01/09/24 Therapeutic Exercise: Bike level 3 x 5 mins while gathering subjective and planning session with patient Seated hamstring stretch 2x30 BIL Seated thoracic ext arms across chest x2' Supine bridge 2x10 3 hold at top Modified thomas stretch EOM x1' BIL Therapeutic Activity: STS 2x10 arms crossed Standing 3 way hip on theraband oval foam pad 2x10 BIL   [facility] Adult PT Treatment:                                                DATE: 01/07/2024  Therapeutic Exercise: Supine bridge on Pball Supine EOB hip flexor stretch 2x1' Seated hamstring stretch 2x1'  Therapeutic Activity: Rec bike 8'  Standing hip 3 way  2x12 B, hold 1s STS from low surface 2x12, no ue a. Hip hinge practice    PATIENT EDUCATION:  Education details: Discussed eval findings, rehab rationale and POC and patient is in agreement  Person educated: Patient Education method: Explanation and Handouts Education comprehension: verbalized understanding and needs further education  HOME EXERCISE PROGRAM: Access Code: [email or web address] URL: [email or web address] Date: 12/24/2023 Prepared by: Reyes Kohut  Exercises - Static Prone on Elbows  - 2 x daily - 5 x weekly - 1 sets - 1 reps - 2 min hold - Supine 90/90 Abdominal Bracing  - 2 x daily - 5 x weekly - 1 sets - 2 reps - 60s hold - Seated Table Hamstring  Stretch  - 2 x daily - 5 x weekly - 1 sets - 2 reps - 30s hold  ASSESSMENT:  CLINICAL IMPRESSION: Pt attended physical therapy session for continuation of treatment regarding LBP. Today's treatment focused on improvement of  dynamic core strength, core activation patterns, proximal hip strength, posterior chain endurance/motility, and hip hinge education/technique practice. Pt continues to give reports of minimal pain an no change in day to day function. Pt showed great tolerance to administered treatment with no adverse effects by the end of session. Skilled intervention was utilized via activity modification for pt tolerance with task completion, functional progression/regression promoting best outcomes inline with current rehab goals, as well as minimal  verbal/tactile cuing alongside no physical assistance for safe and appropriate performance of today's activities. Continue with therapeutic focus on current POC outline, consider d/c within next few visits if pt continues to dmeonstrate no changes.SABRA   EVAL: Patient is a 68 y.o. male  who was seen today for physical therapy evaluation and treatment for chronic low back pain due to suspected degenerative changes/stenosis, no imaging studies to reference.  Patient presents with mobility restrictions in lumbar extension, negative neural tension signs, weakness in core and LE strength and 20% perceived disability on ODI.  Patient is a good candidate for OPPT to regain mobility and increase core and  LE strength/function.  OBJECTIVE IMPAIRMENTS: Abnormal gait, decreased activity tolerance, decreased knowledge of condition, difficulty walking, decreased strength, improper body mechanics, obesity, and pain.   ACTIVITY LIMITATIONS: carrying, lifting, standing, and prolonged walking  PERSONAL FACTORS: Fitness, Past/current experiences, and Time since onset of injury/illness/exacerbation are also affecting patient's functional outcome.   REHAB POTENTIAL:  Good  CLINICAL DECISION MAKING: Stable/uncomplicated  EVALUATION COMPLEXITY: Moderate   GOALS: Goals reviewed with patient? No  SHORT TERM GOALS: Target date: 01/14/2024  Patient to demonstrate independence in HEP  Baseline: Goal status: INITIAL  2.  80d hamstring mobility B Baseline: 70d B Goal status: INITIAL   LONG TERM GOALS: Target date: 02/18/2024  Patient will increase 30s chair stand reps from 8 to 10 without arms to demonstrate and improved functional ability with less pain/difficulty as well as reduce fall risk.  Baseline: 8 Goal status: INITIAL  2.  Patient will acknowledge 4/10 worst pain at least once during episode of care   Baseline: 7/10 worst Goal status: INITIAL  3.  Patient will score at least 6/50 on ODI to signify clinically meaningful improvement in functional abilities.   Baseline: 10/50 Goal status: INITIAL  4.  Increase lumbar extension to 50% Baseline:  AROM eval  Flexion 75%  Extension 10%  Right lateral flexion 75%  Left lateral flexion 75%   Goal status: INITIAL  PLAN:  PT FREQUENCY: 1-2x/week  PT DURATION: 6 weeks  PLANNED INTERVENTIONS: 97110-Therapeutic exercises, 97530- Therapeutic activity, [id number]- Neuromuscular re-education, 97535- Self Care, 02859- Manual therapy, [id number]- Gait training, [phone] (1-2 muscles), 20561 (3+ muscles)- Dry Needling, Patient/Family education, Balance training, and Stair training.  PLAN FOR NEXT SESSION: HEP review and update, manual techniques as appropriate, aerobic tasks, ROM and flexibility activities, strengthening and PREs, TPDN, gait and balance training as needed     Mabel Kiang, PT, DPT 01/29/2024, 11:20 AM

## 2024-02-03 ENCOUNTER — Ambulatory Visit: Admitting: Physical Therapy

## 2024-02-03 DIAGNOSIS — M5459 Other low back pain: Secondary | ICD-10-CM | POA: Diagnosis not present

## 2024-02-03 DIAGNOSIS — M6281 Muscle weakness (generalized): Secondary | ICD-10-CM

## 2024-02-03 DIAGNOSIS — R2689 Other abnormalities of gait and mobility: Secondary | ICD-10-CM | POA: Diagnosis not present

## 2024-02-03 NOTE — Therapy (Signed)
 OUTPATIENT PHYSICAL THERAPY TREATMENT NOTE   Patient Name: Riley Cortez MRN: [id number] DOB:1955-10-26, 68 y.o., male Today's Date: 02/03/2024  END OF SESSION:  PT End of Session - 02/03/24 1115     Visit Number 6    Number of Visits 12    Date for Recertification  02/23/24    Authorization Type BCBS    PT Start Time 1045    PT Stop Time 1125    PT Time Calculation (min) 40 min    Activity Tolerance Patient tolerated treatment well    Behavior During Therapy WFL for tasks assessed/performed            Past Medical History:  Diagnosis Date   Cancer (HCC)    skin   HIV infection (HCC)    Hyperlipidemia    Myocardial infarct [facility])    Past Surgical History:  Procedure Laterality Date   NASAL SEPTUM SURGERY     PTCA     skin cancer removal     Patient Active Problem List   Diagnosis Date Noted   COVID-19 04/07/2020   Nephrolithiasis 12/22/2019   Lymphedema of lower extremity 11/11/2018   Edema 10/14/2017   Obesity (BMI 30-39.9) 12/30/2013   TIA (transient ischemic attack) 01/22/2012   DISEASE, DISSEMINATED, D/T MYCOBACTERIA 06/14/2006   Herpes simplex virus (HSV) infection 06/14/2006   Human immunodeficiency virus (HIV) disease (HCC) 01/31/2006   Hyperlipidemia 01/31/2006   Chorioretinitis 01/31/2006   MYOCARDIAL INFARCTION, HX OF 05/10/2001   CORONARY ARTERY DISEASE, S/P PTCA 05/10/2001    PCP: Loretha Richerd SAUNDERS, MD   REFERRING PROVIDER: Eben Reyes BROCKS, MD  REFERRING DIAG: R60.0 (ICD-10-CM) - Localized edema I25.9 (ICD-10-CM) - Chronic ischemic heart disease, unspecified  Rationale for Evaluation and Treatment: Rehabilitation  THERAPY DIAG:  Other low back pain  Muscle weakness (generalized)  Other abnormalities of gait and mobility  ONSET DATE: 5 years  SUBJECTIVE:                                                                                                                                                                                            SUBJECTIVE STATEMENT: Pt attended today's session with reports of 1/10 pain. Pt stated that they have maintained fair compliance with current HEP.  Has been complaint with HEP  PERTINENT HISTORY:  68 yo M with hx of HIV+, hyperlipidemia, and HTN.  Is on atripla (via assistance fund), lipitor.   Prev TIA Sept 2013.  Prev basal cell CA of R leg excised. repeat excision 09-2022. Has regular derm f/u 12-2023.  Had kidney boulder removed 2020 (litho, stent, removal).  Traveling as usual, [place] ([address], [address]  [address]), [place], [place], [place] ([place]), [place].    Has been doing well with his atripla. Some issues with freq refills (monthly).  Has been having more LE swelling, on diuretic, given compression socks. Was seen by [facility].   PAIN:  Are you having pain? Yes: NPRS scale: 1-7/10 Pain location: low back Pain description: ache Aggravating factors: standing, mowing and prolonged walking Relieving factors: sitting  PRECAUTIONS: Other: HIV  RED FLAGS: None   WEIGHT BEARING RESTRICTIONS: No  FALLS:  Has patient fallen in last 6 months? Yes. Number of falls 1due to low BP   OCCUPATION: retired  PLOF: Independent  PATIENT GOALS: To manage my low back   NEXT MD VISIT: TBD  OBJECTIVE:  Note: Objective measures were completed at Evaluation unless otherwise noted.  DIAGNOSTIC FINDINGS:  none  PATIENT SURVEYS:  Modified Oswestry:   Interpretation of scores: Score Category Description  0-20% Minimal Disability The patient can cope with most living activities. Usually no treatment is indicated apart from advice on lifting, sitting and exercise  21-40% Moderate Disability The patient experiences more pain and difficulty with sitting, lifting and standing. Travel and social life are more difficult and they may be disabled from work. Personal care, sexual activity and sleeping are not grossly affected, and the patient can usually be managed by conservative means  41-60%  Severe Disability Pain remains the main problem in this group, but activities of daily living are affected. These patients require a detailed investigation  61-80% Crippled Back pain impinges on all aspects of the patient's life. Positive intervention is required  81-100% Bed-bound  These patients are either bed-bound or exaggerating their symptoms  Bluford FORBES Zoe DELENA Karon DELENA, et al. Surgery versus conservative management of stable thoracolumbar fracture: the PRESTO feasibility RCT. [place] ([place]): [employer]; 2021 Nov. [facility] Technology Assessment, No. 25.62.) Appendix 3, Oswestry Disability Index category descriptors. Available from: FindJewelers.cz  Minimally Clinically Important Difference (MCID) = 12.8%  MUSCLE LENGTH: Hamstrings: Right 70 deg; Left 70 deg PKB negative B  POSTURE: decreased lumbar lordosis  PALPATION: deferred  LUMBAR ROM:   AROM eval  Flexion 75%  Extension 10%  Right lateral flexion 75%  Left lateral flexion 75%  Right rotation   Left rotation    (Blank rows = not tested)  LOWER EXTREMITY ROM:   WFL  Active  Right eval Left eval  Hip flexion    Hip extension    Hip abduction    Hip adduction    Hip internal rotation    Hip external rotation    Knee flexion    Knee extension    Ankle dorsiflexion    Ankle plantarflexion    Ankle inversion    Ankle eversion     (Blank rows = not tested)  LOWER EXTREMITY MMT:    MMT Right eval Left eval  Hip flexion    Hip extension    Hip abduction    Hip adduction    Hip internal rotation    Hip external rotation    Knee flexion    Knee extension    Ankle dorsiflexion    Ankle plantarflexion    Ankle inversion    Ankle eversion     (Blank rows = not tested)  LUMBAR SPECIAL TESTS:  Straight leg raise test: Negative and Slump test: Negative  FUNCTIONAL TESTS:  30 seconds chair stand test 8 reps arms crossed  GAIT: Distance walked:  65ftx2 Assistive device utilized:  None Level of assistance: Complete Independence Comments: antalgic  TREATMENT:     [facility] Adult PT Treatment:                                                DATE: 02/03/2024 Neuromuscular re-ed: Cable axe chop  2x12B Supine bridge on pball 2x12, hold 5s Seated hamstring stretch w/apt 2x1' Therapeutic Activity: NuStep 8' for activity tolerance Seated lumbar flexion w/row 2x15, springboard 4 ft away Standing  hip abduction 2x12, hold 2s RTB at ankles UE a. Squat  2x10, RTB at knee   [facility] Adult PT Treatment:                                                DATE: 01/29/2024 Neuromuscular re-ed: Pball roll up 2x12, hold for a full breath of air in/out Supine bridge w/ SL extension at top 2x8B, hold 3s Therapeutic Activity: NuStep 8' for activity tolerance Seated lumbar flexion w/row 2x15, springboard 4 ft away Hip hinge practice 2x 8, BW   [facility] Adult PT Treatment:                                                DATE: 01/09/24 Therapeutic Exercise: Bike level 3 x 5 mins while gathering subjective and planning session with patient Seated hamstring stretch 2x30 BIL Seated thoracic ext arms across chest x2' Supine bridge 2x10 3 hold at top Modified thomas stretch EOM x1' BIL Therapeutic Activity: STS 2x10 arms crossed Standing 3 way hip on theraband oval foam pad 2x10 BIL   [facility] Adult PT Treatment:                                                DATE: 01/07/2024  Therapeutic Exercise: Supine bridge on Pball Supine EOB hip flexor stretch 2x1' Seated hamstring stretch 2x1'  Therapeutic Activity: Rec bike 8'  Standing hip 3 way  2x12 B, hold 1s STS from low surface 2x12, no ue a. Hip hinge practice    PATIENT EDUCATION:  Education details: Discussed eval findings, rehab rationale and POC and patient is in agreement  Person educated: Patient Education method: Explanation and Handouts Education comprehension: verbalized  understanding and needs further education  HOME EXERCISE PROGRAM: Access Code: [email or web address] URL: [email or web address] Date: 12/24/2023 Prepared by: Reyes Kohut  Exercises - Static Prone on Elbows  - 2 x daily - 5 x weekly - 1 sets - 1 reps - 2 min hold - Supine 90/90 Abdominal Bracing  - 2 x daily - 5 x weekly - 1 sets - 2 reps - 60s hold - Seated Table Hamstring Stretch  - 2 x daily - 5 x weekly - 1 sets - 2 reps - 30s hold  ASSESSMENT:  CLINICAL IMPRESSION: Pt attended physical therapy session for continuation of treatment regarding LBP. Today's treatment focused on improvement of  dynamic core strength, core activation patterns, proximal hip strength, posterior chain endurance/motility, and hip hinge education/technique practice. Pt continues  to give reports of 0/10 pain and no change in day to day function. Pt showed great tolerance to administered treatment with no adverse effects by the end of session. Skilled intervention was utilized via activity modification for pt tolerance with task completion, functional progression/regression promoting best outcomes inline with current rehab goals, as well as minimal  verbal/tactile cuing alongside no physical assistance for safe and appropriate performance of today's activities. Continue with therapeutic focus on current POC outline, consider d/c within next few visits if pt continues plateau with progress.   EVAL: Patient is a 68 y.o. male who was seen today for physical therapy evaluation and treatment for chronic low back pain due to suspected degenerative changes/stenosis, no imaging studies to reference.  Patient presents with mobility restrictions in lumbar extension, negative neural tension signs, weakness in core and LE strength and 20% perceived disability on ODI.  Patient is a good candidate for OPPT to regain mobility and increase core and  LE strength/function.  OBJECTIVE IMPAIRMENTS: Abnormal gait, decreased activity  tolerance, decreased knowledge of condition, difficulty walking, decreased strength, improper body mechanics, obesity, and pain.   ACTIVITY LIMITATIONS: carrying, lifting, standing, and prolonged walking  PERSONAL FACTORS: Fitness, Past/current experiences, and Time since onset of injury/illness/exacerbation are also affecting patient's functional outcome.   REHAB POTENTIAL: Good  CLINICAL DECISION MAKING: Stable/uncomplicated  EVALUATION COMPLEXITY: Moderate   GOALS: Goals reviewed with patient? No  SHORT TERM GOALS: Target date: 01/14/2024  Patient to demonstrate independence in HEP  Baseline: Goal status: INITIAL  2.  80d hamstring mobility B Baseline: 70d B Goal status: INITIAL   LONG TERM GOALS: Target date: 02/18/2024  Patient will increase 30s chair stand reps from 8 to 10 without arms to demonstrate and improved functional ability with less pain/difficulty as well as reduce fall risk.  Baseline: 8 Goal status: INITIAL  2.  Patient will acknowledge 4/10 worst pain at least once during episode of care   Baseline: 7/10 worst Goal status: INITIAL  3.  Patient will score at least 6/50 on ODI to signify clinically meaningful improvement in functional abilities.   Baseline: 10/50 Goal status: INITIAL  4.  Increase lumbar extension to 50% Baseline:  AROM eval  Flexion 75%  Extension 10%  Right lateral flexion 75%  Left lateral flexion 75%   Goal status: INITIAL  PLAN:  PT FREQUENCY: 1-2x/week  PT DURATION: 6 weeks  PLANNED INTERVENTIONS: 97110-Therapeutic exercises, 97530- Therapeutic activity, [id number]- Neuromuscular re-education, 97535- Self Care, 02859- Manual therapy, [id number]- Gait training, [phone] (1-2 muscles), 20561 (3+ muscles)- Dry Needling, Patient/Family education, Balance training, and Stair training.  PLAN FOR NEXT SESSION: HEP review and update, manual techniques as appropriate, aerobic tasks, ROM and flexibility activities, strengthening and PREs,  TPDN, gait and balance training as needed     Mabel Kiang, PT, DPT 02/03/2024, 11:28 AM

## 2024-02-04 DIAGNOSIS — K080 Exfoliation of teeth due to systemic causes: Secondary | ICD-10-CM | POA: Diagnosis not present

## 2024-02-05 ENCOUNTER — Ambulatory Visit

## 2024-02-06 ENCOUNTER — Ambulatory Visit: Admitting: Physical Therapy

## 2024-02-06 NOTE — Therapy (Signed)
 OUTPATIENT PHYSICAL THERAPY TREATMENT NOTE   Patient Name: Riley Cortez MRN: [id number] DOB:[birth year], 68 y.o., male Today's Date: 02/12/2024  END OF SESSION:  PT End of Session - 02/12/24 1038     Visit Number 7    Number of Visits 12    Date for Recertification  02/23/24    Authorization Type BCBS    PT Start Time 1045    PT Stop Time 1123    PT Time Calculation (min) 38 min    Activity Tolerance Patient tolerated treatment well    Behavior During Therapy WFL for tasks assessed/performed          Past Medical History:  Diagnosis Date   Cancer (HCC)    skin   HIV infection (HCC)    Hyperlipidemia    Myocardial infarct [facility])    Past Surgical History:  Procedure Laterality Date   NASAL SEPTUM SURGERY     PTCA     skin cancer removal     Patient Active Problem List   Diagnosis Date Noted   COVID-19 04/07/2020   Nephrolithiasis 12/22/2019   Lymphedema of lower extremity 11/11/2018   Edema 10/14/2017   Obesity (BMI 30-39.9) 12/30/2013   TIA (transient ischemic attack) 01/22/2012   DISEASE, DISSEMINATED, D/T MYCOBACTERIA 06/14/2006   Herpes simplex virus (HSV) infection 06/14/2006   Human immunodeficiency virus (HIV) disease (HCC) 01/31/2006   Hyperlipidemia 01/31/2006   Chorioretinitis 01/31/2006   MYOCARDIAL INFARCTION, HX OF 05/10/2001   CORONARY ARTERY DISEASE, S/P PTCA 05/10/2001    PCP: Loretha Richerd SAUNDERS, MD   REFERRING PROVIDER: Eben Reyes BROCKS, MD  REFERRING DIAG: R60.0 (ICD-10-CM) - Localized edema I25.9 (ICD-10-CM) - Chronic ischemic heart disease, unspecified  Rationale for Evaluation and Treatment: Rehabilitation  THERAPY DIAG:  Other low back pain  Other abnormalities of gait and mobility  Muscle weakness (generalized)  ONSET DATE: 5 years  SUBJECTIVE:                                                                                                                                                                                            SUBJECTIVE STATEMENT: Patient reports pain occurs mostly when standing for >10 minutes.  PERTINENT HISTORY:  68 yo M with hx of HIV+, hyperlipidemia, and HTN.  Is on atripla (via assistance fund), lipitor.   Prev TIA Sept 2013.  Prev basal cell CA of R leg excised. repeat excision 09-2022. Has regular derm f/u 12-2023.  Had kidney boulder removed 2020 (litho, stent, removal).     Traveling as usual, [place] ([address], [address]  [address]), [place], [place], [place] ([place]), [place].    Has been  doing well with his atripla. Some issues with freq refills (monthly).  Has been having more LE swelling, on diuretic, given compression socks. Was seen by [facility].   PAIN:  Are you having pain? Yes: NPRS scale: 1-7/10 Pain location: low back Pain description: ache Aggravating factors: standing, mowing and prolonged walking Relieving factors: sitting  PRECAUTIONS: Other: HIV  RED FLAGS: None   WEIGHT BEARING RESTRICTIONS: No  FALLS:  Has patient fallen in last 6 months? Yes. Number of falls 1due to low BP   OCCUPATION: retired  PLOF: Independent  PATIENT GOALS: To manage my low back   NEXT MD VISIT: TBD  OBJECTIVE:  Note: Objective measures were completed at Evaluation unless otherwise noted.  DIAGNOSTIC FINDINGS:  none  PATIENT SURVEYS:  Modified Oswestry:   Interpretation of scores: Score Category Description  0-20% Minimal Disability The patient can cope with most living activities. Usually no treatment is indicated apart from advice on lifting, sitting and exercise  21-40% Moderate Disability The patient experiences more pain and difficulty with sitting, lifting and standing. Travel and social life are more difficult and they may be disabled from work. Personal care, sexual activity and sleeping are not grossly affected, and the patient can usually be managed by conservative means  41-60% Severe Disability Pain remains the main problem in this group, but activities of daily  living are affected. These patients require a detailed investigation  61-80% Crippled Back pain impinges on all aspects of the patient's life. Positive intervention is required  81-100% Bed-bound  These patients are either bed-bound or exaggerating their symptoms  Bluford FORBES Zoe DELENA Karon DELENA, et al. Surgery versus conservative management of stable thoracolumbar fracture: the PRESTO feasibility RCT. [place] ([place]): [employer]; 2021 Nov. [facility] Technology Assessment, No. 25.62.) Appendix 3, Oswestry Disability Index category descriptors. Available from: FindJewelers.cz  Minimally Clinically Important Difference (MCID) = 12.8%  MUSCLE LENGTH: Hamstrings: Right 70 deg; Left 70 deg PKB negative B  POSTURE: decreased lumbar lordosis  PALPATION: deferred  LUMBAR ROM:   AROM eval  Flexion 75%  Extension 10%  Right lateral flexion 75%  Left lateral flexion 75%  Right rotation   Left rotation    (Blank rows = not tested)  LOWER EXTREMITY ROM:   WFL  Active  Right eval Left eval  Hip flexion    Hip extension    Hip abduction    Hip adduction    Hip internal rotation    Hip external rotation    Knee flexion    Knee extension    Ankle dorsiflexion    Ankle plantarflexion    Ankle inversion    Ankle eversion     (Blank rows = not tested)  LOWER EXTREMITY MMT:    MMT Right eval Left eval  Hip flexion    Hip extension    Hip abduction    Hip adduction    Hip internal rotation    Hip external rotation    Knee flexion    Knee extension    Ankle dorsiflexion    Ankle plantarflexion    Ankle inversion    Ankle eversion     (Blank rows = not tested)  LUMBAR SPECIAL TESTS:  Straight leg raise test: Negative and Slump test: Negative  FUNCTIONAL TESTS:  30 seconds chair stand test 8 reps arms crossed  GAIT: Distance walked: 27ftx2 Assistive device utilized: None Level of assistance: Complete Independence Comments:  antalgic  TREATMENT:    [facility] Adult PT Treatment:  DATE: 02/12/24 Neuromuscular re-ed: Wendee axe chop  2x12B Supine bridge on pball 2x12 Seated hamstring stretch 2x30 BIL Modified thomas stretch EOM x1' BIL Therapeutic Activity: NuStep level 5 8' for activity tolerance STS with blue ball OH press 2x10  [facility] Adult PT Treatment:                                                DATE: 02/03/2024 Neuromuscular re-ed: Cable axe chop  2x12B Supine bridge on pball 2x12, hold 5s Seated hamstring stretch w/apt 2x1' Therapeutic Activity: NuStep 8' for activity tolerance Seated lumbar flexion w/row 2x15, springboard 4 ft away Standing  hip abduction 2x12, hold 2s RTB at ankles UE a. Squat  2x10, RTB at knee   [facility] Adult PT Treatment:                                                DATE: 01/29/2024 Neuromuscular re-ed: Pball roll up 2x12, hold for a full breath of air in/out Supine bridge w/ SL extension at top 2x8B, hold 3s Therapeutic Activity: NuStep 8' for activity tolerance Seated lumbar flexion w/row 2x15, springboard 4 ft away Hip hinge practice 2x 8, BW   PATIENT EDUCATION:  Education details: Discussed eval findings, rehab rationale and POC and patient is in agreement  Person educated: Patient Education method: Explanation and Handouts Education comprehension: verbalized understanding and needs further education  HOME EXERCISE PROGRAM: Access Code: [email or web address] URL: [email or web address] Date: 12/24/2023 Prepared by: Reyes Kohut  Exercises - Static Prone on Elbows  - 2 x daily - 5 x weekly - 1 sets - 1 reps - 2 min hold - Supine 90/90 Abdominal Bracing  - 2 x daily - 5 x weekly - 1 sets - 2 reps - 60s hold - Seated Table Hamstring Stretch  - 2 x daily - 5 x weekly - 1 sets - 2 reps - 30s hold  ASSESSMENT:  CLINICAL IMPRESSION: Patient presents to PT reporting minimal current back pain, states he  notices it the most when standing for greater than 10 minutes. Session today focused on core strengthening and stretching for hips to improve upright posture. Patient was able to tolerate all prescribed exercises with no adverse effects. Patient continues to benefit from skilled PT services and should be progressed as able to improve functional independence.    EVAL: Patient is a 68 y.o. male who was seen today for physical therapy evaluation and treatment for chronic low back pain due to suspected degenerative changes/stenosis, no imaging studies to reference.  Patient presents with mobility restrictions in lumbar extension, negative neural tension signs, weakness in core and LE strength and 20% perceived disability on ODI.  Patient is a good candidate for OPPT to regain mobility and increase core and  LE strength/function.  OBJECTIVE IMPAIRMENTS: Abnormal gait, decreased activity tolerance, decreased knowledge of condition, difficulty walking, decreased strength, improper body mechanics, obesity, and pain.   ACTIVITY LIMITATIONS: carrying, lifting, standing, and prolonged walking  PERSONAL FACTORS: Fitness, Past/current experiences, and Time since onset of injury/illness/exacerbation are also affecting patient's functional outcome.   REHAB POTENTIAL: Good  CLINICAL DECISION MAKING: Stable/uncomplicated  EVALUATION COMPLEXITY: Moderate   GOALS: Goals reviewed with patient? No  SHORT TERM GOALS: Target date:  01/14/2024  Patient to demonstrate independence in HEP  Baseline: Goal status: MET  2.  80d hamstring mobility B Baseline: 70d B Goal status: Ongoing   LONG TERM GOALS: Target date: 02/18/2024  Patient will increase 30s chair stand reps from 8 to 10 without arms to demonstrate and improved functional ability with less pain/difficulty as well as reduce fall risk.  Baseline: 8 Goal status: INITIAL  2.  Patient will acknowledge 4/10 worst pain at least once during episode of  care   Baseline: 7/10 worst Goal status: INITIAL  3.  Patient will score at least 6/50 on ODI to signify clinically meaningful improvement in functional abilities.   Baseline: 10/50 Goal status: INITIAL  4.  Increase lumbar extension to 50% Baseline:  AROM eval  Flexion 75%  Extension 10%  Right lateral flexion 75%  Left lateral flexion 75%   Goal status: INITIAL  PLAN:  PT FREQUENCY: 1-2x/week  PT DURATION: 6 weeks  PLANNED INTERVENTIONS: 97110-Therapeutic exercises, 97530- Therapeutic activity, [id number]- Neuromuscular re-education, 97535- Self Care, 02859- Manual therapy, [id number]- Gait training, [phone] (1-2 muscles), [id number] (3+ muscles)- Dry Needling, Patient/Family education, Balance training, and Stair training.  PLAN FOR NEXT SESSION: HEP review and update, manual techniques as appropriate, aerobic tasks, ROM and flexibility activities, strengthening and PREs, TPDN, gait and balance training as needed     Corean Pouch PTA  02/12/2024, 11:22 AM

## 2024-02-10 ENCOUNTER — Encounter

## 2024-02-12 ENCOUNTER — Ambulatory Visit

## 2024-02-12 DIAGNOSIS — M6281 Muscle weakness (generalized): Secondary | ICD-10-CM | POA: Diagnosis not present

## 2024-02-12 DIAGNOSIS — M5459 Other low back pain: Secondary | ICD-10-CM | POA: Diagnosis not present

## 2024-02-12 DIAGNOSIS — R2689 Other abnormalities of gait and mobility: Secondary | ICD-10-CM | POA: Diagnosis not present

## 2024-02-17 ENCOUNTER — Ambulatory Visit

## 2024-02-17 DIAGNOSIS — R2689 Other abnormalities of gait and mobility: Secondary | ICD-10-CM

## 2024-02-17 DIAGNOSIS — M5459 Other low back pain: Secondary | ICD-10-CM

## 2024-02-17 DIAGNOSIS — M6281 Muscle weakness (generalized): Secondary | ICD-10-CM

## 2024-02-17 NOTE — Therapy (Signed)
 OUTPATIENT PHYSICAL THERAPY TREATMENT NOTE   Patient Name: Riley Cortez MRN: [id number] DOB:04-Jul-1955, 68 y.o., male Today's Date: 02/17/2024  END OF SESSION:  PT End of Session - 02/17/24 1043     Visit Number 8    Number of Visits 12    Date for Recertification  02/23/24    Authorization Type BCBS    PT Start Time 1045    PT Stop Time 1125    PT Time Calculation (min) 40 min    Activity Tolerance Patient tolerated treatment well    Behavior During Therapy WFL for tasks assessed/performed          Past Medical History:  Diagnosis Date   Cancer (HCC)    skin   HIV infection (HCC)    Hyperlipidemia    Myocardial infarct (HCC)    Past Surgical History:  Procedure Laterality Date   NASAL SEPTUM SURGERY     PTCA     skin cancer removal     Patient Active Problem List   Diagnosis Date Noted   COVID-19 04/07/2020   Nephrolithiasis 12/22/2019   Lymphedema of lower extremity 11/11/2018   Edema 10/14/2017   Obesity (BMI 30-39.9) 12/30/2013   TIA (transient ischemic attack) 01/22/2012   DISEASE, DISSEMINATED, D/T MYCOBACTERIA 06/14/2006   Herpes simplex virus (HSV) infection 06/14/2006   Human immunodeficiency virus (HIV) disease (HCC) 01/31/2006   Hyperlipidemia 01/31/2006   Chorioretinitis 01/31/2006   MYOCARDIAL INFARCTION, HX OF 05/10/2001   CORONARY ARTERY DISEASE, S/P PTCA 05/10/2001    PCP: Loretha Richerd SAUNDERS, MD   REFERRING PROVIDER: Eben Reyes BROCKS, MD  REFERRING DIAG: R60.0 (ICD-10-CM) - Localized edema I25.9 (ICD-10-CM) - Chronic ischemic heart disease, unspecified  Rationale for Evaluation and Treatment: Rehabilitation  THERAPY DIAG:  Other low back pain  Other abnormalities of gait and mobility  Muscle weakness (generalized)  ONSET DATE: 5 years  SUBJECTIVE:                                                                                                                                                                                            SUBJECTIVE STATEMENT: Patient reports he felt ok after his last session.   PERTINENT HISTORY:  68 yo M with hx of HIV+, hyperlipidemia, and HTN.  Is on atripla (via assistance fund), lipitor.   Prev TIA Sept 2013.  Prev basal cell CA of R leg excised. repeat excision 09-2022. Has regular derm f/u 12-2023.  Had kidney boulder removed 2020 (litho, stent, removal).     Traveling as usual, [place] ([address], [address]), [place], [place], [place] ([employer]), [place].    Has been  doing well with his atripla. Some issues with freq refills (monthly).  Has been having more LE swelling, on diuretic, given compression socks. Was seen by [facility].   PAIN:  Are you having pain? Yes: NPRS scale: 1-7/10 Pain location: low back Pain description: ache Aggravating factors: standing, mowing and prolonged walking Relieving factors: sitting  PRECAUTIONS: Other: HIV  RED FLAGS: None   WEIGHT BEARING RESTRICTIONS: No  FALLS:  Has patient fallen in last 6 months? Yes. Number of falls 1due to low BP   OCCUPATION: retired  PLOF: Independent  PATIENT GOALS: To manage my low back   NEXT MD VISIT: TBD  OBJECTIVE:  Note: Objective measures were completed at Evaluation unless otherwise noted.  DIAGNOSTIC FINDINGS:  none  PATIENT SURVEYS:  Modified Oswestry:   Interpretation of scores: Score Category Description  0-20% Minimal Disability The patient can cope with most living activities. Usually no treatment is indicated apart from advice on lifting, sitting and exercise  21-40% Moderate Disability The patient experiences more pain and difficulty with sitting, lifting and standing. Travel and social life are more difficult and they may be disabled from work. Personal care, sexual activity and sleeping are not grossly affected, and the patient can usually be managed by conservative means  41-60% Severe Disability Pain remains the main problem in this group, but activities of daily living are  affected. These patients require a detailed investigation  61-80% Crippled Back pain impinges on all aspects of the patient's life. Positive intervention is required  81-100% Bed-bound  These patients are either bed-bound or exaggerating their symptoms  Bluford FORBES Zoe DELENA Karon DELENA, et al. Surgery versus conservative management of stable thoracolumbar fracture: the PRESTO feasibility RCT. [place] ([place]): [employer]; 2021 Nov. [facility] Technology Assessment, No. 25.62.) Appendix 3, Oswestry Disability Index category descriptors. Available from: Findjewelers.cz  Minimally Clinically Important Difference (MCID) = 12.8%  MUSCLE LENGTH: Hamstrings: Right 70 deg; Left 70 deg PKB negative B  POSTURE: decreased lumbar lordosis  PALPATION: deferred  LUMBAR ROM:   AROM eval  Flexion 75%  Extension 10%  Right lateral flexion 75%  Left lateral flexion 75%  Right rotation   Left rotation    (Blank rows = not tested)  LOWER EXTREMITY ROM:   WFL  Active  Right eval Left eval  Hip flexion    Hip extension    Hip abduction    Hip adduction    Hip internal rotation    Hip external rotation    Knee flexion    Knee extension    Ankle dorsiflexion    Ankle plantarflexion    Ankle inversion    Ankle eversion     (Blank rows = not tested)  LOWER EXTREMITY MMT:    MMT Right eval Left eval  Hip flexion    Hip extension    Hip abduction    Hip adduction    Hip internal rotation    Hip external rotation    Knee flexion    Knee extension    Ankle dorsiflexion    Ankle plantarflexion    Ankle inversion    Ankle eversion     (Blank rows = not tested)  LUMBAR SPECIAL TESTS:  Straight leg raise test: Negative and Slump test: Negative  FUNCTIONAL TESTS:  30 seconds chair stand test 8 reps arms crossed  GAIT: Distance walked: 38ftx2 Assistive device utilized: None Level of assistance: Complete Independence Comments:  antalgic  TREATMENT:    Neuromuscular re-ed: Palloff press 13#  2x10 BIL Seated hamstring stretch 2x30 BIL Modified thomas stretch EOM x1' BIL Therapeutic Activity:  NuStep level 6 8' for activity tolerance STS with blue ball OH press 2x10 Standing hip abduction/extension 2x10 ea BIL  [facility] Adult PT Treatment:                                                DATE: 02/12/24 Neuromuscular re-ed: Wendee axe chop  2x12B Supine bridge on pball 2x12 Seated hamstring stretch 2x30 BIL Modified thomas stretch EOM x1' BIL Therapeutic Activity: NuStep level 5 8' for activity tolerance STS with blue ball OH press 2x10  [facility] Adult PT Treatment:                                                DATE: 02/03/2024 Neuromuscular re-ed: Cable axe chop  2x12B Supine bridge on pball 2x12, hold 5s Seated hamstring stretch w/apt 2x1' Therapeutic Activity: NuStep 8' for activity tolerance Seated lumbar flexion w/row 2x15, springboard 4 ft away Standing  hip abduction 2x12, hold 2s RTB at ankles UE a. Squat  2x10, RTB at knee   PATIENT EDUCATION:  Education details: Discussed eval findings, rehab rationale and POC and patient is in agreement  Person educated: Patient Education method: Explanation and Handouts Education comprehension: verbalized understanding and needs further education  HOME EXERCISE PROGRAM: Access Code: [email or web address] URL: [email or web address] Date: 12/24/2023 Prepared by: Reyes Kohut  Exercises - Static Prone on Elbows  - 2 x daily - 5 x weekly - 1 sets - 1 reps - 2 min hold - Supine 90/90 Abdominal Bracing  - 2 x daily - 5 x weekly - 1 sets - 2 reps - 60s hold - Seated Table Hamstring Stretch  - 2 x daily - 5 x weekly - 1 sets - 2 reps - 30s hold  ASSESSMENT:  CLINICAL IMPRESSION: Patient presents to PT reporting minimal current back pain. He states that he continues to have back pain with prolonged standing and also notices that his balance is not  as good as it used to be. Session today focused on proximal hip and core strengthening with more standing exercises to incorporate balance training as well. Patient was able to tolerate all prescribed exercises with no adverse effects. Patient continues to benefit from skilled PT services and should be progressed as able to improve functional independence.   EVAL: Patient is a 68 y.o. male who was seen today for physical therapy evaluation and treatment for chronic low back pain due to suspected degenerative changes/stenosis, no imaging studies to reference.  Patient presents with mobility restrictions in lumbar extension, negative neural tension signs, weakness in core and LE strength and 20% perceived disability on ODI.  Patient is a good candidate for OPPT to regain mobility and increase core and  LE strength/function.  OBJECTIVE IMPAIRMENTS: Abnormal gait, decreased activity tolerance, decreased knowledge of condition, difficulty walking, decreased strength, improper body mechanics, obesity, and pain.   ACTIVITY LIMITATIONS: carrying, lifting, standing, and prolonged walking  PERSONAL FACTORS: Fitness, Past/current experiences, and Time since onset of injury/illness/exacerbation are also affecting patient's functional outcome.   REHAB POTENTIAL: Good  CLINICAL DECISION MAKING: Stable/uncomplicated  EVALUATION COMPLEXITY: Moderate   GOALS: Goals reviewed with patient? No  SHORT TERM GOALS: Target date: 01/14/2024  Patient to demonstrate independence in HEP  Baseline: Goal status: MET  2.  80d hamstring mobility B Baseline: 70d B Goal status: Ongoing   LONG TERM GOALS: Target date: 02/18/2024  Patient will increase 30s chair stand reps from 8 to 10 without arms to demonstrate and improved functional ability with less pain/difficulty as well as reduce fall risk.  Baseline: 8 Goal status: INITIAL  2.  Patient will acknowledge 4/10 worst pain at least once during episode of care    Baseline: 7/10 worst Goal status: INITIAL  3.  Patient will score at least 6/50 on ODI to signify clinically meaningful improvement in functional abilities.   Baseline: 10/50 Goal status: INITIAL  4.  Increase lumbar extension to 50% Baseline:  AROM eval  Flexion 75%  Extension 10%  Right lateral flexion 75%  Left lateral flexion 75%   Goal status: INITIAL  PLAN:  PT FREQUENCY: 1-2x/week  PT DURATION: 6 weeks  PLANNED INTERVENTIONS: 97110-Therapeutic exercises, 97530- Therapeutic activity, [id number]- Neuromuscular re-education, 97535- Self Care, 02859- Manual therapy, [id number]- Gait training, [phone] (1-2 muscles), [id number] (3+ muscles)- Dry Needling, Patient/Family education, Balance training, and Stair training.  PLAN FOR NEXT SESSION: HEP review and update, manual techniques as appropriate, aerobic tasks, ROM and flexibility activities, strengthening and PREs, TPDN, gait and balance training as needed     Corean Pouch PTA  02/17/2024, 11:22 AM

## 2024-02-19 ENCOUNTER — Ambulatory Visit

## 2024-02-19 DIAGNOSIS — M5459 Other low back pain: Secondary | ICD-10-CM | POA: Diagnosis not present

## 2024-02-19 DIAGNOSIS — M6281 Muscle weakness (generalized): Secondary | ICD-10-CM

## 2024-02-19 DIAGNOSIS — R2689 Other abnormalities of gait and mobility: Secondary | ICD-10-CM | POA: Diagnosis not present

## 2024-02-19 NOTE — Therapy (Addendum)
 OUTPATIENT PHYSICAL THERAPY TREATMENT NOTE/DISCHARGE   Patient Name: Riley Cortez MRN: [id number] DOB:1955/08/13, 68 y.o., male Today's Date: 02/19/2024  END OF SESSION:  PT End of Session - 02/19/24 1041     Visit Number 9    Number of Visits 12    Date for Recertification  02/23/24    Authorization Type BCBS    PT Start Time 1045    PT Stop Time 1115    PT Time Calculation (min) 30 min    Activity Tolerance Patient tolerated treatment well    Behavior During Therapy WFL for tasks assessed/performed          Past Medical History:  Diagnosis Date   Cancer (HCC)    skin   HIV infection (HCC)    Hyperlipidemia    Myocardial infarct [facility])    Past Surgical History:  Procedure Laterality Date   NASAL SEPTUM SURGERY     PTCA     skin cancer removal     Patient Active Problem List   Diagnosis Date Noted   COVID-19 04/07/2020   Nephrolithiasis 12/22/2019   Lymphedema of lower extremity 11/11/2018   Edema 10/14/2017   Obesity (BMI 30-39.9) 12/30/2013   TIA (transient ischemic attack) 01/22/2012   DISEASE, DISSEMINATED, D/T MYCOBACTERIA 06/14/2006   Herpes simplex virus (HSV) infection 06/14/2006   Human immunodeficiency virus (HIV) disease (HCC) 01/31/2006   Hyperlipidemia 01/31/2006   Chorioretinitis 01/31/2006   MYOCARDIAL INFARCTION, HX OF 05/10/2001   CORONARY ARTERY DISEASE, S/P PTCA 05/10/2001    PCP: Loretha Richerd SAUNDERS, MD   REFERRING PROVIDER: Eben Reyes BROCKS, MD  REFERRING DIAG: R60.0 (ICD-10-CM) - Localized edema I25.9 (ICD-10-CM) - Chronic ischemic heart disease, unspecified  Rationale for Evaluation and Treatment: Rehabilitation  THERAPY DIAG:  Other low back pain  Other abnormalities of gait and mobility  Muscle weakness (generalized)  ONSET DATE: 5 years  SUBJECTIVE:                                                                                                                                                                                            SUBJECTIVE STATEMENT: Patient reports he had no soreness after previous session, is feeling good about discharging from PT at this time.   PERTINENT HISTORY:  68 yo M with hx of HIV+, hyperlipidemia, and HTN.  Is on atripla (via assistance fund), lipitor.   Prev TIA Sept 2013.  Prev basal cell CA of R leg excised. repeat excision 09-2022. Has regular derm f/u 12-2023.  Had kidney boulder removed 2020 (litho, stent, removal).     Traveling as usual, [place] ([address] [place]), [place],  [place], [place] ([place]), [place].    Has been doing well with his atripla. Some issues with freq refills (monthly).  Has been having more LE swelling, on diuretic, given compression socks. Was seen by [facility].   PAIN:  Are you having pain? Yes: NPRS scale: 1-7/10 Pain location: low back Pain description: ache Aggravating factors: standing, mowing and prolonged walking Relieving factors: sitting  PRECAUTIONS: Other: HIV  RED FLAGS: None   WEIGHT BEARING RESTRICTIONS: No  FALLS:  Has patient fallen in last 6 months? Yes. Number of falls 1due to low BP   OCCUPATION: retired  PLOF: Independent  PATIENT GOALS: To manage my low back   NEXT MD VISIT: TBD  OBJECTIVE:  Note: Objective measures were completed at Evaluation unless otherwise noted.  DIAGNOSTIC FINDINGS:  none  PATIENT SURVEYS:  Modified Oswestry:   Interpretation of scores: Score Category Description  0-20% Minimal Disability The patient can cope with most living activities. Usually no treatment is indicated apart from advice on lifting, sitting and exercise  21-40% Moderate Disability The patient experiences more pain and difficulty with sitting, lifting and standing. Travel and social life are more difficult and they may be disabled from work. Personal care, sexual activity and sleeping are not grossly affected, and the patient can usually be managed by conservative means  41-60% Severe Disability Pain remains  the main problem in this group, but activities of daily living are affected. These patients require a detailed investigation  61-80% Crippled Back pain impinges on all aspects of the patient's life. Positive intervention is required  81-100% Bed-bound  These patients are either bed-bound or exaggerating their symptoms  Bluford FORBES Zoe DELENA Karon DELENA, et al. Surgery versus conservative management of stable thoracolumbar fracture: the PRESTO feasibility RCT. [place] ([place]): [employer]; 2021 Nov. [employer] Assessment, No. 25.62.) Appendix 3, Oswestry Disability Index category descriptors. Available from: Findjewelers.cz  Minimally Clinically Important Difference (MCID) = 12.8%  MUSCLE LENGTH: Hamstrings: Right 70 deg; Left 70 deg 02/19/24: Right 70 Left 75 PKB negative B  POSTURE: decreased lumbar lordosis  PALPATION: deferred  LUMBAR ROM:   AROM eval  Flexion 75%  Extension 10%  Right lateral flexion 75%  Left lateral flexion 75%  Right rotation   Left rotation    (Blank rows = not tested)  LOWER EXTREMITY ROM:   WFL  Active  Right eval Left eval  Hip flexion    Hip extension    Hip abduction    Hip adduction    Hip internal rotation    Hip external rotation    Knee flexion    Knee extension    Ankle dorsiflexion    Ankle plantarflexion    Ankle inversion    Ankle eversion     (Blank rows = not tested)  LOWER EXTREMITY MMT:    MMT Right eval Left eval  Hip flexion    Hip extension    Hip abduction    Hip adduction    Hip internal rotation    Hip external rotation    Knee flexion    Knee extension    Ankle dorsiflexion    Ankle plantarflexion    Ankle inversion    Ankle eversion     (Blank rows = not tested)  LUMBAR SPECIAL TESTS:  Straight leg raise test: Negative and Slump test: Negative  FUNCTIONAL TESTS:  30 seconds chair stand test 8 reps arms crossed  GAIT: Distance walked:  69ftx2 Assistive device utilized: None Level of  assistance: Complete Independence Comments: antalgic  TREATMENT:   [facility] Adult PT Treatment:                                                DATE: 02/19/24 Therapeutic Exercise: NuStep level 6 x 8' for activity tolerance Therapeutic Activity: Discussion of goals, review and update of HEP 30s STS 11 reps   [facility] Adult PT Treatment:                                                DATE: 02/17/24 Neuromuscular re-ed: Palloff press 13# 2x10 BIL Seated hamstring stretch 2x30 BIL Modified thomas stretch EOM x1' BIL Therapeutic Activity:  NuStep level 6 8' for activity tolerance STS with blue ball OH press 2x10 Standing hip abduction/extension 2x10 ea BIL  [facility] Adult PT Treatment:                                                DATE: 02/12/24 Neuromuscular re-ed: Cable axe chop  2x12B Supine bridge on pball 2x12 Seated hamstring stretch 2x30 BIL Modified thomas stretch EOM x1' BIL Therapeutic Activity: NuStep level 5 8' for activity tolerance STS with blue ball OH press 2x10    PATIENT EDUCATION:  Education details: Discussed eval findings, rehab rationale and POC and patient is in agreement  Person educated: Patient Education method: Explanation and Handouts Education comprehension: verbalized understanding and needs further education  HOME EXERCISE PROGRAM: Access Code: [email or web address] URL: [email or web address] Date: 02/19/2024 Prepared by: Corean Pouch  Exercises - Static Prone on Elbows  - 1 x daily - 5 x weekly - 1 sets - 1 reps - 2 min hold - Supine 90/90 Abdominal Bracing  - 1 x daily - 5 x weekly - 1 sets - 2 reps - 60s hold - Seated Table Hamstring Stretch  - 1 x daily - 5 x weekly - 1 sets - 2 reps - 30s hold - Sit to Stand Without Arm Support  - 1 x daily - 5 x weekly - 2 sets - 10 reps - Standing 3-way Hip with Walker  - 1 x daily - 5 x weekly - 2 sets - 10 reps  ASSESSMENT:  CLINICAL  IMPRESSION: Patient presents to PT reporting no current back pain. He has met or partially met all of his STG and LTG. He shows improvement in perceived function with decreased ODI score. He shows improved strength and function with exceeding 30sSTS LTG. Patient is appropriate for DC from PT at this time.   EVAL: Patient is a 68 y.o. male who was seen today for physical therapy evaluation and treatment for chronic low back pain due to suspected degenerative changes/stenosis, no imaging studies to reference.  Patient presents with mobility restrictions in lumbar extension, negative neural tension signs, weakness in core and LE strength and 20% perceived disability on ODI.  Patient is a good candidate for OPPT to regain mobility and increase core and  LE strength/function.  OBJECTIVE IMPAIRMENTS: Abnormal gait, decreased activity tolerance, decreased knowledge of condition, difficulty walking, decreased strength, improper body mechanics, obesity, and pain.  ACTIVITY LIMITATIONS: carrying, lifting, standing, and prolonged walking  PERSONAL FACTORS: Fitness, Past/current experiences, and Time since onset of injury/illness/exacerbation are also affecting patient's functional outcome.   REHAB POTENTIAL: Good  CLINICAL DECISION MAKING: Stable/uncomplicated  EVALUATION COMPLEXITY: Moderate   GOALS: Goals reviewed with patient? No  SHORT TERM GOALS: Target date: 01/14/2024  Patient to demonstrate independence in HEP  Baseline: Goal status: MET  2.  80d hamstring mobility B Baseline: 70d B Goal status: Partially met   LONG TERM GOALS: Target date: 02/18/2024  Patient will increase 30s chair stand reps from 8 to 10 without arms to demonstrate and improved functional ability with less pain/difficulty as well as reduce fall risk.  Baseline: 8 Goal status: MET 02/19/24: 11 reps  2.  Patient will acknowledge 4/10 worst pain at least once during episode of care   Baseline: 7/10 worst Goal  status: Partially met 02/19/24: 5/10  3.  Patient will score at least 6/50 on ODI to signify clinically meaningful improvement in functional abilities.   Baseline: 10/50 Goal status: Partially met 02/19/24: 8/50  4.  Increase lumbar extension to 50% Baseline:  AROM eval  Flexion 75%  Extension 10%  Right lateral flexion 75%  Left lateral flexion 75%   Goal status: MET 02/18/25: 50%  PLAN:  PT FREQUENCY: 1-2x/week  PT DURATION: 6 weeks  PLANNED INTERVENTIONS: 97110-Therapeutic exercises, 97530- Therapeutic activity, 97112- Neuromuscular re-education, 97535- Self Care, 02859- Manual therapy, [id number]- Gait training, [phone] (1-2 muscles), [id number] (3+ muscles)- Dry Needling, Patient/Family education, Balance training, and Stair training.  PLAN FOR NEXT SESSION: HEP review and update, manual techniques as appropriate, aerobic tasks, ROM and flexibility activities, strengthening and PREs, TPDN, gait and balance training as needed     Corean Pouch PTA  02/19/2024, 11:19 AM

## 2024-02-26 DIAGNOSIS — E782 Mixed hyperlipidemia: Secondary | ICD-10-CM | POA: Diagnosis not present

## 2024-03-02 ENCOUNTER — Other Ambulatory Visit: Payer: Self-pay | Admitting: Infectious Diseases

## 2024-03-02 ENCOUNTER — Other Ambulatory Visit (HOSPITAL_COMMUNITY): Payer: Self-pay | Admitting: Cardiology

## 2024-03-02 DIAGNOSIS — Z8673 Personal history of transient ischemic attack (TIA), and cerebral infarction without residual deficits: Secondary | ICD-10-CM

## 2024-03-02 DIAGNOSIS — I255 Ischemic cardiomyopathy: Secondary | ICD-10-CM | POA: Diagnosis not present

## 2024-03-02 DIAGNOSIS — E782 Mixed hyperlipidemia: Secondary | ICD-10-CM | POA: Diagnosis not present

## 2024-03-02 DIAGNOSIS — I10 Essential (primary) hypertension: Secondary | ICD-10-CM | POA: Diagnosis not present

## 2024-03-02 DIAGNOSIS — I2510 Atherosclerotic heart disease of native coronary artery without angina pectoris: Secondary | ICD-10-CM | POA: Diagnosis not present

## 2024-03-02 DIAGNOSIS — B20 Human immunodeficiency virus [HIV] disease: Secondary | ICD-10-CM

## 2024-03-02 MED ORDER — EFAVIRENZ-EMTRICITAB-TENOFO DF 600-200-300 MG PO TABS: 1.0000 | ORAL_TABLET | Freq: Every day | ORAL | 4 refills | Status: AC

## 2024-03-02 NOTE — Telephone Encounter (Unsigned)
 Copied from CRM [id number]. Topic: Clinical - Medication Refill >> Mar 02, 2024  9:40 AM DeAngela L wrote: Medication: efavirenz -emtricitab-tenofovir  (ATRIPLA) 600-200-300 MG tablet  Has the patient contacted their pharmacy? Yes  (Agent: If no, request that the patient contact the pharmacy for the refill. If patient does not wish to contact the pharmacy document the reason why and proceed with request.) (Agent: If yes, when and what did the pharmacy advise?)  This is the patient's preferred pharmacy:  [facility] [place]  - [place], [place] - [address] [place] [address] Phone: [phone] Fax: [phone]  Is this the correct pharmacy for this prescription? Yes  If no, delete pharmacy and type the correct one.   Has the prescription been filled recently? Yes   Is the patient out of the medication? No   Has the patient been seen for an appointment in the last year OR does the patient have an upcoming appointment? Yes   Can we respond through MyChart? Yes   Agent: Please be advised that Rx refills may take up to 3 business days. We ask that you follow-up with your pharmacy.

## 2024-03-02 NOTE — Telephone Encounter (Signed)
 Rx was called in to the pharmacy per Kenneth DEL RN.

## 2024-03-06 DIAGNOSIS — Z125 Encounter for screening for malignant neoplasm of prostate: Secondary | ICD-10-CM | POA: Diagnosis not present

## 2024-03-06 DIAGNOSIS — I10 Essential (primary) hypertension: Secondary | ICD-10-CM | POA: Diagnosis not present

## 2024-03-06 DIAGNOSIS — Z0000 Encounter for general adult medical examination without abnormal findings: Secondary | ICD-10-CM | POA: Diagnosis not present

## 2024-03-06 DIAGNOSIS — B20 Human immunodeficiency virus [HIV] disease: Secondary | ICD-10-CM | POA: Diagnosis not present

## 2024-03-06 DIAGNOSIS — E669 Obesity, unspecified: Secondary | ICD-10-CM | POA: Diagnosis not present

## 2024-03-06 DIAGNOSIS — E785 Hyperlipidemia, unspecified: Secondary | ICD-10-CM | POA: Diagnosis not present

## 2024-03-20 ENCOUNTER — Other Ambulatory Visit (HOSPITAL_COMMUNITY)

## 2024-03-23 ENCOUNTER — Other Ambulatory Visit (HOSPITAL_COMMUNITY)

## 2024-03-23 DIAGNOSIS — H52203 Unspecified astigmatism, bilateral: Secondary | ICD-10-CM | POA: Diagnosis not present

## 2024-04-06 ENCOUNTER — Encounter (HOSPITAL_BASED_OUTPATIENT_CLINIC_OR_DEPARTMENT_OTHER): Payer: Self-pay

## 2024-04-06 ENCOUNTER — Other Ambulatory Visit (HOSPITAL_BASED_OUTPATIENT_CLINIC_OR_DEPARTMENT_OTHER)

## 2024-04-13 ENCOUNTER — Other Ambulatory Visit (INDEPENDENT_AMBULATORY_CARE_PROVIDER_SITE_OTHER)

## 2024-04-13 DIAGNOSIS — Z8673 Personal history of transient ischemic attack (TIA), and cerebral infarction without residual deficits: Secondary | ICD-10-CM | POA: Diagnosis not present

## 2024-04-13 LAB — ECHOCARDIOGRAM COMPLETE
Area-P 1/2: 3.31 cm2
Est EF: 50
MV M vel: 3.9 m/s
MV Peak grad: 60.8 mmHg
S' Lateral: 3.21 cm

## 2024-04-13 MED ORDER — PERFLUTREN LIPID MICROSPHERE
1.0000 mL | INTRAVENOUS | Status: AC | PRN
  Administered 2024-04-13: 1 mL via INTRAVENOUS
